# Patient Record
Sex: Female | Born: 1950
Health system: Southern US, Community
[De-identification: ages and names within clinical notes are randomized; demographics above are authoritative.]

## PROBLEM LIST (undated history)

## (undated) DIAGNOSIS — C801 Malignant (primary) neoplasm, unspecified: Secondary | ICD-10-CM

## (undated) DIAGNOSIS — N200 Calculus of kidney: Secondary | ICD-10-CM

## (undated) HISTORY — PX: CHOLECYSTECTOMY: SHX55

## (undated) HISTORY — DX: Malignant (primary) neoplasm, unspecified: C80.1

## (undated) HISTORY — DX: Calculus of kidney: N20.0

---

## 1998-05-06 ENCOUNTER — Ambulatory Visit (HOSPITAL_COMMUNITY): Admission: RE | Admit: 1998-05-06 | Discharge: 1998-05-06 | Payer: Self-pay | Admitting: Obstetrics and Gynecology

## 1998-05-08 ENCOUNTER — Ambulatory Visit (HOSPITAL_COMMUNITY): Admission: RE | Admit: 1998-05-08 | Discharge: 1998-05-08 | Payer: Self-pay

## 1998-05-08 ENCOUNTER — Encounter: Payer: Self-pay | Admitting: Obstetrics and Gynecology

## 1998-11-18 ENCOUNTER — Ambulatory Visit (HOSPITAL_COMMUNITY): Admission: RE | Admit: 1998-11-18 | Discharge: 1998-11-18 | Payer: Self-pay | Admitting: Obstetrics and Gynecology

## 1998-11-18 ENCOUNTER — Encounter: Payer: Self-pay | Admitting: Obstetrics and Gynecology

## 2005-04-12 ENCOUNTER — Emergency Department (HOSPITAL_COMMUNITY): Admission: EM | Admit: 2005-04-12 | Discharge: 2005-04-12 | Payer: Self-pay | Admitting: Family Medicine

## 2006-06-07 ENCOUNTER — Emergency Department (HOSPITAL_COMMUNITY): Admission: EM | Admit: 2006-06-07 | Discharge: 2006-06-07 | Payer: Self-pay | Admitting: Emergency Medicine

## 2009-01-15 ENCOUNTER — Encounter: Payer: Self-pay | Admitting: Family Medicine

## 2009-01-15 ENCOUNTER — Ambulatory Visit: Payer: Self-pay | Admitting: Family Medicine

## 2009-01-15 ENCOUNTER — Other Ambulatory Visit: Admission: RE | Admit: 2009-01-15 | Discharge: 2009-01-15 | Payer: Self-pay | Admitting: Family Medicine

## 2009-01-15 DIAGNOSIS — Z85828 Personal history of other malignant neoplasm of skin: Secondary | ICD-10-CM

## 2009-01-22 ENCOUNTER — Ambulatory Visit: Payer: Self-pay | Admitting: Family Medicine

## 2009-01-22 ENCOUNTER — Encounter: Payer: Self-pay | Admitting: Family Medicine

## 2009-08-01 ENCOUNTER — Ambulatory Visit: Payer: Self-pay | Admitting: Family Medicine

## 2009-08-01 DIAGNOSIS — F329 Major depressive disorder, single episode, unspecified: Secondary | ICD-10-CM

## 2009-08-12 ENCOUNTER — Ambulatory Visit: Payer: Self-pay | Admitting: Family Medicine

## 2010-01-23 ENCOUNTER — Ambulatory Visit: Payer: Self-pay | Admitting: Family Medicine

## 2010-01-23 LAB — CONVERTED CEMR LAB
ALT: 25 units/L (ref 0–35)
AST: 24 units/L (ref 0–37)
Albumin: 4 g/dL (ref 3.5–5.2)
Alkaline Phosphatase: 55 units/L (ref 39–117)
BUN: 15 mg/dL (ref 6–23)
Basophils Absolute: 0 10*3/uL (ref 0.0–0.1)
Basophils Relative: 0.4 % (ref 0.0–3.0)
Bilirubin Urine: NEGATIVE
Bilirubin, Direct: 0.1 mg/dL (ref 0.0–0.3)
CO2: 27 meq/L (ref 19–32)
Calcium: 9.4 mg/dL (ref 8.4–10.5)
Chloride: 107 meq/L (ref 96–112)
Cholesterol: 206 mg/dL — ABNORMAL HIGH (ref 0–200)
Creatinine, Ser: 0.8 mg/dL (ref 0.4–1.2)
Direct LDL: 118.3 mg/dL
Eosinophils Absolute: 0.2 10*3/uL (ref 0.0–0.7)
Eosinophils Relative: 2.1 % (ref 0.0–5.0)
GFR calc non Af Amer: 82.95 mL/min (ref >60)
Glucose, Bld: 92 mg/dL (ref 70–99)
Glucose, Urine, Semiquant: NEGATIVE
HCT: 43.2 % (ref 36.0–46.0)
HDL: 37.3 mg/dL — ABNORMAL LOW (ref >39.00)
Hemoglobin: 15.1 g/dL — ABNORMAL HIGH (ref 12.0–15.0)
Ketones, urine, test strip: NEGATIVE
Lymphocytes Relative: 25.8 % (ref 12.0–46.0)
Lymphs Abs: 2 10*3/uL (ref 0.7–4.0)
MCHC: 35.1 g/dL (ref 30.0–36.0)
MCV: 95.6 fL (ref 78.0–100.0)
Monocytes Absolute: 0.7 10*3/uL (ref 0.1–1.0)
Monocytes Relative: 9 % (ref 3.0–12.0)
Neutro Abs: 4.9 10*3/uL (ref 1.4–7.7)
Neutrophils Relative %: 62.7 % (ref 43.0–77.0)
Nitrite: NEGATIVE
Platelets: 280 10*3/uL (ref 150.0–400.0)
Potassium: 4.6 meq/L (ref 3.5–5.1)
Protein, U semiquant: NEGATIVE
RBC: 4.51 M/uL (ref 3.87–5.11)
RDW: 13.4 % (ref 11.5–14.6)
Sodium: 142 meq/L (ref 135–145)
Specific Gravity, Urine: 1.015
TSH: 2.34 microintl units/mL (ref 0.35–5.50)
Total Bilirubin: 0.4 mg/dL (ref 0.3–1.2)
Total CHOL/HDL Ratio: 6
Total Protein: 6.9 g/dL (ref 6.0–8.3)
Triglycerides: 253 mg/dL — ABNORMAL HIGH (ref 0.0–149.0)
Urobilinogen, UA: 0.2
VLDL: 50.6 mg/dL — ABNORMAL HIGH (ref 0.0–40.0)
WBC Urine, dipstick: NEGATIVE
WBC: 7.8 10*3/uL (ref 4.5–10.5)
pH: 5

## 2010-01-29 ENCOUNTER — Other Ambulatory Visit: Admission: RE | Admit: 2010-01-29 | Discharge: 2010-01-29 | Payer: Self-pay | Admitting: Family Medicine

## 2010-01-29 ENCOUNTER — Ambulatory Visit: Payer: Self-pay | Admitting: Family Medicine

## 2010-04-22 ENCOUNTER — Telehealth: Payer: Self-pay | Admitting: Family Medicine

## 2010-04-22 ENCOUNTER — Ambulatory Visit: Payer: Self-pay | Admitting: Family Medicine

## 2010-04-27 ENCOUNTER — Ambulatory Visit: Payer: Self-pay | Admitting: Licensed Clinical Social Worker

## 2010-05-06 ENCOUNTER — Ambulatory Visit: Payer: Self-pay | Admitting: Family Medicine

## 2010-05-20 ENCOUNTER — Ambulatory Visit: Payer: Self-pay | Admitting: Licensed Clinical Social Worker

## 2010-07-01 ENCOUNTER — Ambulatory Visit: Admit: 2010-07-01 | Payer: Self-pay | Admitting: Licensed Clinical Social Worker

## 2010-07-19 LAB — CONVERTED CEMR LAB
ALT: 31 units/L (ref 0–35)
AST: 19 units/L (ref 0–37)
Albumin: 3.8 g/dL (ref 3.5–5.2)
Alkaline Phosphatase: 67 units/L (ref 39–117)
BUN: 15 mg/dL (ref 6–23)
Basophils Absolute: 0 10*3/uL (ref 0.0–0.1)
Basophils Relative: 0.4 % (ref 0.0–3.0)
Bilirubin Urine: NEGATIVE
Bilirubin, Direct: 0 mg/dL (ref 0.0–0.3)
CO2: 29 meq/L (ref 19–32)
Calcium: 9.4 mg/dL (ref 8.4–10.5)
Chloride: 110 meq/L (ref 96–112)
Cholesterol: 212 mg/dL — ABNORMAL HIGH (ref 0–200)
Creatinine, Ser: 0.7 mg/dL (ref 0.4–1.2)
Direct LDL: 140.9 mg/dL
Eosinophils Absolute: 0.1 10*3/uL (ref 0.0–0.7)
Eosinophils Relative: 1.3 % (ref 0.0–5.0)
GFR calc non Af Amer: 91.54 mL/min (ref >60)
Glucose, Bld: 88 mg/dL (ref 70–99)
Glucose, Urine, Semiquant: NEGATIVE
HCT: 44.5 % (ref 36.0–46.0)
HDL: 32.3 mg/dL — ABNORMAL LOW (ref >39.00)
Hemoglobin: 15.7 g/dL — ABNORMAL HIGH (ref 12.0–15.0)
Ketones, urine, test strip: NEGATIVE
Lymphocytes Relative: 23.3 % (ref 12.0–46.0)
Lymphs Abs: 1.8 10*3/uL (ref 0.7–4.0)
MCHC: 35.3 g/dL (ref 30.0–36.0)
MCV: 91.8 fL (ref 78.0–100.0)
Monocytes Absolute: 0.6 10*3/uL (ref 0.1–1.0)
Monocytes Relative: 7.9 % (ref 3.0–12.0)
Neutro Abs: 5.3 10*3/uL (ref 1.4–7.7)
Neutrophils Relative %: 67.1 % (ref 43.0–77.0)
Nitrite: NEGATIVE
Platelets: 289 10*3/uL (ref 150.0–400.0)
Potassium: 4.2 meq/L (ref 3.5–5.1)
Protein, U semiquant: NEGATIVE
RBC: 4.85 M/uL (ref 3.87–5.11)
RDW: 12.6 % (ref 11.5–14.6)
Sodium: 143 meq/L (ref 135–145)
Specific Gravity, Urine: >=1.03
TSH: 1.45 microintl units/mL (ref 0.35–5.50)
Total Bilirubin: 1.1 mg/dL (ref 0.3–1.2)
Total CHOL/HDL Ratio: 7
Total Protein: 7.3 g/dL (ref 6.0–8.3)
Triglycerides: 186 mg/dL — ABNORMAL HIGH (ref 0.0–149.0)
Urobilinogen, UA: 0.2
VLDL: 37.2 mg/dL (ref 0.0–40.0)
WBC Urine, dipstick: NEGATIVE
WBC: 7.8 10*3/uL (ref 4.5–10.5)
pH: 5.5

## 2010-07-21 NOTE — Assessment & Plan Note (Signed)
Summary: bp check//lch   Vital Signs:  Patient profile:   60 year old female Menstrual status:  postmenopausal Weight:      200 pounds Temp:     98.4 degrees F oral BP sitting:   160 / 90  (left arm) Cuff size:   regular  Vitals Entered By: Kern Reap CMA Duncan Dull) (April 22, 2010 8:17 AM) CC: blood pressure concerns   CC:  blood pressure concerns.  History of Present Illness: Julia Norman is a 60 year old female, who comes in today for evaluation of 2 problems.  She is been checking her blood pressure at a local pharmacy.  Its been running 140/94.  She decided to come in and get it checked here today.  She's never had a blood pressure in the past, although there is a positive family history........ father and brother.......... who have hypertension.  Physical examination in August.  We were on Celexa 20 mg daily for some mild depression in the the rest of 10.5, one to two tablets b.i.d., p.r.n.  She stopped the Celexa for reasons unknown, sure of.  She states she didn't have any side effects.  She does quit taking them.  Is also having more trouble with anxiety and depression and smoking a lot of cigarettes.  I recommend at this juncture.  She sees Judithe Modest and restart her Celexa  Allergies: 1)  Codeine  Past History:  Past medical, surgical, family and social histories (including risk factors) reviewed for relevance to current acute and chronic problems.  Past Medical History: Reviewed history from 01/15/2009 and no changes required. skin cancer kidney stones childbirth x 21 adopted stepchild  Past Surgical History: Reviewed history from 01/15/2009 and no changes required. Cholecystectomy  Family History: Reviewed history from 01/15/2009 and no changes required. Father: heart disease Mother: deceased - cancer, gall bladder, depression, repritory hypertention Siblings: 2 brothers                1 -  depression               1 - MS               Social  History: Reviewed history from 01/15/2009 and no changes required. 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 Take 1 tablet by mouth every morning  Problem # 2:  DEPRESSIVE DISORDER (ICD-311) Assessment: Deteriorated  Her updated medication list for this problem includes:    Lorazepam 0.5 Mg Tabs (Lorazepam) .Marland Kitchen... Take 1-2 tabs two times a day as needed for axiety    Celexa 20 Mg Tabs (Citalopram hydrobromide) .Marland Kitchen... Take 1 tablet by mouth every morning  Complete Medication List: 1)  Lorazepam 0.5 Mg Tabs (Lorazepam) .... Take 1-2 tabs two times a day as needed for axiety 2)  Celexa 20 Mg Tabs (Citalopram hydrobromide) .... Take 1 tablet by mouth every morning 3)  Maxzide-25 37.5-25 Mg Tabs (Triamterene-hctz) .... Take 1 tablet by mouth every morning  Patient Instructions:  1)  restart these Celexa 20 mg a day at bedtime.  Call [facility] today to make an appointment to see Judithe Modest ASAP. 2)  Begin hydrochlorothiazide 25 mg one tablet daily in the morning. 3)  Purchase a pump  up digital blood pressure cuff, and measured y  blood pressure every morning.  Return in two weeks for follow-up and bring all the data and the device with you Prescriptions: MAXZIDE-25 37.5-25 MG TABS (TRIAMTERENE-HCTZ) Take 1 tablet by mouth every morning  #100 x 3   Entered and Authorized by:   Roderick Pee MD   Signed  by:   Roderick Pee MD on 04/22/2010   Method used:   Electronically to        [facility] (retail)       [address]       [place], [place]  [address]       Ph: [phone]       Fax: [phone]   RxID:   [id number]    Orders Added: 1)  Est. Patient Level IV [24401]

## 2010-07-21 NOTE — Assessment & Plan Note (Signed)
Summary: CPX/PAP/NJR   Vital Signs:  Patient profile:   60 year old female Menstrual status:  postmenopausal Height:      61.5 inches Weight:      200 pounds BMI:     37.31 Temp:     98.0 degrees F oral BP sitting:   140 / 90  (left arm) Cuff size:   regular  Vitals Entered By: Julia Norman CMA (January 29, 2010 9:39 AM) CC: cpx with labs, pap, src   CC:  cpx with labs, pap, and src.  History of Present Illness: Julia Norman is a 60 year old female, who comes in today for general physical examination because she is a history of mild underlying depression secondary to the fact that her granddaughter has a brain tumor............. unfortunately she died in 12-14-22.....  She gets routine eye care, dental care, BSE monthly, annual mammography by portable unit.  Recommend digital mammogram at the breast Center.  Tetanus 2010, seasonal flu 2010, colonoscopy, normal 4 years ago,  Review of systems negative, except she is having some sharp pain in the right eighth, ninth, 10th ribs.  She states has been gone on for about 4 months.  If it occurs maybe once or twice a week.  It sharp lasts for a few seconds and goes away.  It is associated with twisting or turning.  No cardiac or pulmonary symptoms.  No history of trauma  She recently had a squamous cell carcinoma removed from her left lower extremity by Dr. Terri Piedra, her dermatologist.  She has had a history of skin cancers in the past  she's also noticed bilateral decrease in hearing.  She did have exposure to loud noises.  A teenager  Preventive Screening-Counseling & Management  Alcohol-Tobacco     Smoking Status: quit  Current Medications (verified): 1)  Lorazepam 0.5 Mg Tabs (Lorazepam) .... Take 1-2 Tabs Two Times A Day As Needed For Axiety  Allergies (verified): 1)  Codeine  Past History:  Past medical, surgical, family and social histories (including risk factors) reviewed, and no changes noted (except as noted below).  Past Medical  History: Reviewed history from 01/15/2009 and no changes required. skin cancer kidney stones childbirth x 21 adopted stepchild  Past Surgical History: Reviewed history from 01/15/2009 and no changes required. Cholecystectomy  Family History: Reviewed history from 01/15/2009 and no changes required. Father: heart disease Mother: deceased - cancer, gall bladder, depression, repritory hypertention Siblings: 2 brothers                1 -  depression               1 - MS               Social History: Reviewed history from 01/15/2009 and no changes required. 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 No apparent alopecia or balding. Eyes:  No corneal or conjunctival inflammation noted. EOMI. Perrla. Funduscopic exam benign, without hemorrhages, exudates or papilledema. Vision grossly normal. Ears:  External ear exam shows no significant lesions or deformities.  Otoscopic examination reveals clear canals, tympanic membranes are intact bilaterally without bulging, retraction, inflammation or discharge. Hearing is grossly normal bilaterally. Nose:  External nasal examination shows no deformity or inflammation. Nasal mucosa are pink and moist  without lesions or exudates. Mouth:  Oral mucosa and oropharynx without lesions or exudates.  Teeth in good repair. Neck:  No deformities, masses, or tenderness noted. Chest Wall:  No deformities, masses, or tenderness noted. Breasts:  No mass, nodules, thickening, tenderness, bulging, retraction, inflamation, nipple discharge or skin changes noted.   Lungs:  Normal respiratory effort, chest expands symmetrically. Lungs are clear to auscultation, no  crackles or wheezes. Heart:  Normal rate and regular rhythm. S1 and S2 normal without gallop, murmur, click, rub or other extra sounds. Abdomen:  Bowel sounds positive,abdomen soft and non-tender without masses, organomegaly or hernias noted. Rectal:  No external abnormalities noted. Normal sphincter tone. No rectal masses or tenderness. Genitalia:  Pelvic Exam:        External: normal female genitalia without lesions or masses        Vagina: normal without lesions or masses        Cervix: normal without lesions or masses        Adnexa: normal bimanual exam without masses or fullness        Uterus: normal by palpation        Pap smear: performed Msk:  No deformity or scoliosis noted of thoracic or lumbar spine.   Pulses:  R and L carotid,radial,femoral,dorsalis pedis and posterior tibial pulses are full and equal bilaterally Extremities:  No clubbing, cyanosis, edema, or deformity noted with normal full range of motion of all joints.   Neurologic:  No cranial nerve deficits noted. Station and gait are normal. Plantar reflexes are down-going bilaterally. DTRs are symmetrical throughout. Sensory, motor and coordinative functions appear intact. Skin:  total body skin exam normal except for scars from previous skin cancer removal by Dr. Terri Piedra Cervical Nodes:  No lymphadenopathy noted Axillary Nodes:  No palpable lymphadenopathy Inguinal Nodes:  No significant adenopathy Psych:  Cognition and judgment appear intact. Alert and cooperative with normal attention span and concentration. No apparent delusions, illusions, hallucinations   Impression & Recommendations:  Problem # 1:  DEPRESSIVE DISORDER (ICD-311) Assessment Improved  The following medications were removed from the medication list:    Celexa 20 Mg Tabs (Citalopram hydrobromide) .Marland Kitchen... 1 tab @ bedtime Her updated medication list for this problem includes:    Lorazepam 0.5 Mg Tabs (Lorazepam) .Marland Kitchen... Take 1-2 tabs two times a day as  needed for axiety    Celexa 20 Mg Tabs (Citalopram hydrobromide) .Marland Kitchen... Take 1 tablet by mouth every morning  Orders: Prescription Created Electronically [id number]) EKG w/ Interpretation (93000)  Problem # 2:  HEALTH SCREENING (ICD-V70.0) Assessment: Unchanged  Orders: Prescription Created Electronically [id number]) EKG w/ Interpretation (93000)  Complete Medication List: 1)  Lorazepam 0.5 Mg Tabs (Lorazepam) .... Take 1-2 tabs two times a day as needed for axiety 2)  Celexa 20 Mg Tabs (Citalopram hydrobromide) .... Take 1 tablet by mouth every morning  Patient Instructions: 1)  Please schedule a follow-up appointment in 1 year. 2)  It is important that you exercise regularly at least 20 minutes 5 times a week. If you develop chest pain, have severe difficulty breathing, or feel very tired , stop exercising immediately and seek medical attention. 3)  Schedule your mammogram. 4)  Schedule a colonoscopy/sigmoidoscopy to help detect colon cancer. 5)  Take calcium +Vitamin D daily. 6)  Take an Aspirin every day. 7)  I would recommend that you call Julia Norman at [facility], your nose, throat, for an audiogram Prescriptions: CELEXA 20 MG TABS (CITALOPRAM HYDROBROMIDE) Take 1 tablet by mouth every  morning  #100 x 3   Entered and Authorized by:   Julia Pee MD   Signed by:   Julia Pee MD on 01/29/2010   Method used:   Electronically to        [facility]  [address] [id number] (retail)       [address]       [place], [place]  [address]       Ph: [phone] or [phone]       Fax: [phone]   RxID:   [id number]

## 2010-07-21 NOTE — Assessment & Plan Note (Signed)
Summary: to discuss anxiety meds/dm   Vital Signs:  Patient profile:   60 year old female Menstrual status:  postmenopausal Weight:      205 pounds Temp:     98.7 degrees F oral BP sitting:   110 / 80  (left arm) Cuff size:   regular  Vitals Entered By: Kern Reap CMA Duncan Dull) (August 01, 2009 11:26 AM)  Reason for Visit rx for anxiety  History of Present Illness: Julia Norman is a 60 year old female, married, nonsmoker, Merchandiser, retail at [employer], who comes in today for anxiety and depression.  She's been dealing with these symptoms for the past two years.  There is a lot of stress at work, her husband is out of work, her granddaughter has been recently diagnosed with neuroblastoma.  She has fluctuating moods, but states she is sleeping well.  She's also binge eating and is up to 205 pounds.  She denies any suicidal ideation  Allergies: 1)  Codeine  Past History:  Past medical, surgical, family and social histories (including risk factors) reviewed, and no changes noted (except as noted below).  Past Medical History: Reviewed history from 01/15/2009 and no changes required. skin cancer kidney stones childbirth x 21 adopted stepchild  Past Surgical History: Reviewed history from 01/15/2009 and no changes required. Cholecystectomy  Family History: Reviewed history from 01/15/2009 and no changes required. Father: heart disease Mother: deceased - cancer, gall bladder, depression, repritory hypertention Siblings: 2 brothers                1 -  depression               1 - MS               Social History: Reviewed history from 01/15/2009 and no changes required. 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      Problems:  Medical Problems Added: 1)  Dx of Depressive Disorder  (ICD-311)  Impression & Recommendations:  Problem # 1:  DEPRESSIVE DISORDER (ICD-311) Assessment New  Her updated medication list for this problem includes:    Lorazepam 0.5 Mg Tabs (Lorazepam) .Marland Kitchen... Take 1-2 tabs two times a day as needed for axiety    Celexa 20 Mg Tabs (Citalopram hydrobromide) .Marland Kitchen... 1 tab @ bedtime  Complete Medication List: 1)  Lorazepam 0.5 Mg Tabs (Lorazepam) .... Take 1-2 tabs two times a day as needed for axiety 2)  Celexa 20 Mg Tabs (Citalopram hydrobromide) .Marland Kitchen.. 1 tab @ bedtime  Patient Instructions: 1)  begin Celexa 20 mg at bedtime.  Return in one week for follow-up Prescriptions: CELEXA 20 MG TABS (CITALOPRAM HYDROBROMIDE) 1 tab @ bedtime  #30 x 1   Entered and Authorized by:   Roderick Pee MD   Signed by:   Roderick Pee MD on 08/01/2009   Method used:   [facility]        [facility]  [address] [id number]       [address]       [place], [place]  [address]       Ph: [phone] or [phone]       Fax: [phone]   RxID:   [id number]

## 2010-07-21 NOTE — Progress Notes (Signed)
Summary: Pt req script for Celexa to [facility]  Phone Note Refill Request Call back at Work Phone [phone] Message from:  Patient on April 22, 2010 1:25 PM  Refills Requested: Medication #1:  CELEXA 20 MG TABS Take 1 tablet by mouth every morning   Dosage confirmed as above?Dosage Confirmed  Method Requested: Telephone to Pharmacy Initial call taken by: Lucy Antigua,  April 22, 2010 1:25 PM    Prescriptions: CELEXA 20 MG TABS (CITALOPRAM HYDROBROMIDE) Take 1 tablet by mouth every morning  #100 x 3   Entered by:   Kern Reap CMA (AAMA)   Authorized by:   Roderick Pee MD   Signed by:   Kern Reap CMA (AAMA) on 04/22/2010   Method used:   Electronically to        [facility] (retail)       [address]       [place], [place]  [id number]       Ph: [phone]       Fax: [phone]   RxID:   [id number]

## 2010-07-21 NOTE — Assessment & Plan Note (Signed)
Summary: 2 wk rov/njr   Vital Signs:  Patient profile:   60 year old female Menstrual status:  postmenopausal Weight:      197 pounds Temp:     98.4 degrees F oral BP sitting:   120 / 80  (left arm) Cuff size:   regular  Vitals Entered By: Kern Reap CMA Duncan Dull) (May 06, 2010 9:52 AM) CC: follow-up visit   CC:  follow-up visit.  History of Present Illness: Julia Norman is a 60 year old female, who comes in today for evaluation of two problems.  As noted previously.  Her granddaughter died a couple months ago.  She is currently on Celexa 20 mg nightly.  We discussed increasing the dose slightly to 30 to see if that would help decrease her anxiety and depression.  She is on Maxzide 25 daily BP now back to normal 120/80.  No side effects from medication  Allergies: 1)  Codeine  Past History:  Past medical, surgical, family and social histories (including risk factors) reviewed for relevance to current acute and chronic problems.  Past Medical History: Reviewed history from 01/15/2009 and no changes required. skin cancer kidney stones childbirth x 21 adopted stepchild  Past Surgical History: Reviewed history from 01/15/2009 and no changes required. Cholecystectomy  Family History: Reviewed history from 01/15/2009 and no changes required. Father: heart disease Mother: deceased - cancer, gall bladder, depression, repritory hypertention Siblings: 2 brothers                1 -  depression               1 - MS               Social History: Reviewed history from 01/15/2009 and no changes required. 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 no    Any prior history of allergic reactions to egg and/or gelatin? no    Do you have a sensitivity to the preservative Thimersol? no    Do you  have a past history of Guillan-Barre Syndrome? no    Do you currently have an acute febrile illness? no    Have you ever had a severe reaction to latex? no    Vaccine information given and explained to patient? yes    Are you currently pregnant? no    Lot Number:[id number]   Exp Date:12/19/2010   Site Given  Left Deltoid IM   Physical Exam  General:  Well-developed,well-nourished,in no acute distress; alert,appropriate and cooperative throughout examination   Impression & Recommendations:  Problem # 1:  ESSENTIAL HYPERTENSION (ICD-401.9) Assessment Improved  Her updated medication list for this problem includes:    Maxzide-25 37.5-25 Mg Tabs (Triamterene-hctz) .Marland Kitchen... Take 1 tablet by mouth every morning  Problem # 2:  DEPRESSIVE DISORDER (ICD-311) Assessment: Improved  Her updated medication list for this problem includes:    Lorazepam 0.5 Mg Tabs (Lorazepam) .Marland Kitchen... Take 1-2 tabs two times a day as needed for axiety    Celexa 20 Mg Tabs (Citalopram hydrobromide) .Marland Kitchen... Take 1 & 1/2 at bedtime  Complete Medication List: 1)  Lorazepam 0.5 Mg Tabs (Lorazepam) .... Take 1-2 tabs two times a day as needed for axiety 2)  Celexa 20 Mg Tabs (Citalopram hydrobromide) .... Take 1 & 1/2 at  bedtime 3)  Maxzide-25 37.5-25 Mg Tabs (Triamterene-hctz) .... Take 1 tablet by mouth every morning  Other Orders: Admin 1st Vaccine ([address]) Flu Vaccine 13yrs + ([address])  Patient Instructions: 1)  increase the Celexa to 30 mg daily. 2)  Continue Maxzide one tablet daily.  Check your blood pressure at home once weekly,,,,,,,,, "Sunday morning,,,,,,,,,,,, the goal is to keep your blood pressure 135/85 or less. Prescriptions: CELEXA 20 MG TABS (CITALOPRAM HYDROBROMIDE) Take 1 & 1/2 at bedtime  #150 x 3   Entered and Authorized by:   Jeffrey A Todd MD   Signed by:   Jeffrey A Todd MD on 05/06/2010   Method used:   Electronically to        [facility] (retail)       [address]       [place], [place]  [id number]       Ph: [phone]       Fax: [phone]   RxID:   [id number]    Orders Added: 1)  Admin 1st Vaccine [90471] 2)  Flu Vaccine 40yrs + [36644] 3)  Est. Patient Level III [03474]

## 2010-07-21 NOTE — Assessment & Plan Note (Signed)
Summary: 1 wk rov/njr/PT RESCD//CCM   Vital Signs:  Patient profile:   60 year old female Menstrual status:  postmenopausal BP sitting:   120 / 80  (left arm) Cuff size:   regular  Vitals Entered By: Kern Reap CMA Duncan Dull) (August 12, 2009 11:45 AM)  Reason for Visit follow up anxiety  History of Present Illness: Julia Norman is a 60 year old female, who comes in today for evaluation of anxiety.  We start her on Celexa 20 mg nightly 10 days ago.  She states she feels 50% better.  She said her mood is better.  Her anxiety is, less she's not had to take any of the Ativan.  We discussed various options.  She likes to spend 20 mg daily.  Next CPX July  Allergies: 1)  Codeine  Review of Systems      See HPI  Physical Exam  General:  Well-developed,well-nourished,in no acute distress; alert,appropriate and cooperative throughout examination Psych:  Cognition and judgment appear intact. Alert and cooperative with normal attention span and concentration. No apparent delusions, illusions, hallucinations   Impression & Recommendations:  Problem # 1:  DEPRESSIVE DISORDER (ICD-311) Assessment Improved  Her updated medication list for this problem includes:    Lorazepam 0.5 Mg Tabs (Lorazepam) .Marland Kitchen... Take 1-2 tabs two times a day as needed for axiety    Celexa 20 Mg Tabs (Citalopram hydrobromide) .Marland Kitchen... 1 tab @ bedtime  Complete Medication List: 1)  Lorazepam 0.5 Mg Tabs (Lorazepam) .... Take 1-2 tabs two times a day as needed for axiety 2)  Celexa 20 Mg Tabs (Citalopram hydrobromide) .Marland Kitchen.. 1 tab @ bedtime  Patient Instructions: 1)  continued these Celexa 20 mg a day at bedtime.  If in 4 weeks, you wanr t to inc,  the dose to 30 mg a day, and call Fleet Contras .Marland Kitchen... for a voicemail.  We will change her medication by phone.  Return in July for annual exam

## 2011-05-31 ENCOUNTER — Other Ambulatory Visit: Payer: Self-pay | Admitting: *Deleted

## 2011-05-31 MED ORDER — CITALOPRAM HYDROBROMIDE 20 MG PO TABS: 20.0000 mg | ORAL_TABLET | Freq: Every day | ORAL | Status: DC

## 2011-07-02 ENCOUNTER — Other Ambulatory Visit: Payer: Self-pay | Admitting: Family Medicine

## 2011-07-02 MED ORDER — CITALOPRAM HYDROBROMIDE 20 MG PO TABS: 20.0000 mg | ORAL_TABLET | Freq: Every day | ORAL | Status: DC

## 2011-07-02 NOTE — Telephone Encounter (Signed)
Pt has ov sch for 07-19-11. Pt need refill on generic celexa 20 mg. Pt is out. [facility] [phone]

## 2011-07-19 ENCOUNTER — Ambulatory Visit: Payer: Self-pay | Admitting: Family Medicine

## 2011-08-05 ENCOUNTER — Telehealth: Payer: Self-pay | Admitting: Family Medicine

## 2011-08-05 MED ORDER — CITALOPRAM HYDROBROMIDE 20 MG PO TABS: 20.0000 mg | ORAL_TABLET | Freq: Every day | ORAL | Status: DC

## 2011-08-05 NOTE — Telephone Encounter (Signed)
Patient is stating that her rx has run out and her appt is not until 09/03/11. Patient requests to have her citalopram called into [facility] on [address]. Please assist and inform patient when done.

## 2011-08-17 ENCOUNTER — Other Ambulatory Visit (INDEPENDENT_AMBULATORY_CARE_PROVIDER_SITE_OTHER): Payer: 59

## 2011-08-17 DIAGNOSIS — Z0000 Encounter for general adult medical examination without abnormal findings: Secondary | ICD-10-CM

## 2011-08-17 LAB — CBC WITH DIFFERENTIAL/PLATELET
Basophils Absolute: 0 10*3/uL (ref 0.0–0.1)
Eosinophils Relative: 1.9 % (ref 0.0–5.0)
MCV: 93.2 fl (ref 78.0–100.0)
Monocytes Absolute: 0.7 10*3/uL (ref 0.1–1.0)
Monocytes Relative: 7.9 % (ref 3.0–12.0)
Neutrophils Relative %: 67 % (ref 43.0–77.0)
Platelets: 290 10*3/uL (ref 150.0–400.0)
RDW: 13.9 % (ref 11.5–14.6)
WBC: 8.7 10*3/uL (ref 4.5–10.5)

## 2011-08-17 LAB — POCT URINALYSIS DIPSTICK
Bilirubin, UA: NEGATIVE
Leukocytes, UA: NEGATIVE
Nitrite, UA: NEGATIVE
Protein, UA: NEGATIVE
Urobilinogen, UA: 0.2
pH, UA: 5.5

## 2011-08-17 LAB — LIPID PANEL
Cholesterol: 211 mg/dL — ABNORMAL HIGH (ref 0–200)
Total CHOL/HDL Ratio: 5
Triglycerides: 206 mg/dL — ABNORMAL HIGH (ref 0.0–149.0)
VLDL: 41.2 mg/dL — ABNORMAL HIGH (ref 0.0–40.0)

## 2011-08-17 LAB — BASIC METABOLIC PANEL
BUN: 15 mg/dL (ref 6–23)
CO2: 25 mEq/L (ref 19–32)
Chloride: 107 mEq/L (ref 96–112)
Creatinine, Ser: 0.8 mg/dL (ref 0.4–1.2)

## 2011-08-17 LAB — HEPATIC FUNCTION PANEL
ALT: 23 U/L (ref 0–35)
Bilirubin, Direct: 0 mg/dL (ref 0.0–0.3)
Total Bilirubin: 0.2 mg/dL — ABNORMAL LOW (ref 0.3–1.2)

## 2011-08-31 ENCOUNTER — Ambulatory Visit (INDEPENDENT_AMBULATORY_CARE_PROVIDER_SITE_OTHER): Payer: 59 | Admitting: Family Medicine

## 2011-08-31 ENCOUNTER — Other Ambulatory Visit (HOSPITAL_COMMUNITY)
Admission: RE | Admit: 2011-08-31 | Discharge: 2011-08-31 | Disposition: A | Discharge status: Home / Self Care | Payer: 59 | Source: Ambulatory Visit | Attending: Family Medicine | Admitting: Family Medicine

## 2011-08-31 ENCOUNTER — Encounter: Payer: Self-pay | Admitting: Family Medicine

## 2011-08-31 VITALS — BP 140/90 | Temp 98.3°F | Ht 61.25 in | Wt 203.0 lb

## 2011-08-31 DIAGNOSIS — Z01419 Encounter for gynecological examination (general) (routine) without abnormal findings: Secondary | ICD-10-CM

## 2011-08-31 DIAGNOSIS — I10 Essential (primary) hypertension: Secondary | ICD-10-CM

## 2011-08-31 DIAGNOSIS — R319 Hematuria, unspecified: Secondary | ICD-10-CM

## 2011-08-31 LAB — POCT URINALYSIS DIPSTICK
Bilirubin, UA: NEGATIVE
Ketones, UA: NEGATIVE
Leukocytes, UA: NEGATIVE
Protein, UA: NEGATIVE
Spec Grav, UA: 1.025
pH, UA: 6

## 2011-08-31 NOTE — Progress Notes (Signed)
  Subjective:    Patient ID: Julia Norman, female    DOB: Oct 25, 1950, 61 y.o.   MRN: [id number]  HPI Julia Norman is a 61 year old female nonsmoker who comes in today for general physical examination because of mild elevation of her blood pressure and depression  She stopped taking the Maxide and her BP is 140/90 here she states at home her blood pressure is 135/85 or less.  She takes 20 mg of Celexa bedtime  She does not get routine eye care referred to Dr. Vonna Kotyk, regular dental care, BSE sporadically advised and showed how to do BSE monthly, last mammogram 16 months ago advised annual mammography, colonoscopy 2008 normal followup in GI, tetanus 2010.   Review of Systems  Constitutional: Negative.   HENT: Negative.   Eyes: Negative.   Respiratory: Negative.   Cardiovascular: Negative.   Gastrointestinal: Negative.   Genitourinary: Negative.   Musculoskeletal: Negative.   Neurological: Negative.   Hematological: Negative.   Psychiatric/Behavioral: Negative.        Objective:   Physical Exam  Constitutional: She appears well-developed and well-nourished.  HENT:  Head: Normocephalic and atraumatic.  Right Ear: External ear normal.  Left Ear: External ear normal.  Nose: Nose normal.  Mouth/Throat: Oropharynx is clear and moist.  Eyes: EOM are normal. Pupils are equal, round, and reactive to light.  Neck: Normal range of motion. Neck supple. No thyromegaly present.  Cardiovascular: Normal rate, regular rhythm, normal heart sounds and intact distal pulses.  Exam reveals no gallop and no friction rub.   No murmur heard. Pulmonary/Chest: Effort normal and breath sounds normal.  Abdominal: Soft. Bowel sounds are normal. She exhibits no distension and no mass. There is no tenderness. There is no rebound.  Genitourinary: Vagina normal and uterus normal. Guaiac negative stool. No vaginal discharge found.       Bilateral breast exam normal  Musculoskeletal: Normal range of motion.    Lymphadenopathy:    She has no cervical adenopathy.  Neurological: She is alert. She has normal reflexes. No cranial nerve deficit. She exhibits normal muscle tone. Coordination normal.  Skin: Skin is warm and dry.  Psychiatric: She has a normal mood and affect. Her behavior is normal. Judgment and thought content normal.          Assessment & Plan:  Healthy female  Obesity discussed diet exercise and weight loss  Borderline blood pressure continue monitoring at home  History of mild depression continue Celexa 20 mg daily  Recommend annual eye exam regular dental care BSE monthly and you mammography followup with [place] one year sooner if any problems

## 2011-08-31 NOTE — Patient Instructions (Signed)
Continue the Celexa one tablet daily at bedtime  Check your blood pressure weekly to be sure it stays normal,,,,,,,,,, 135/85 or less   work on the diet exercise and weight loss  Dr. Mia Creek is the eye doctor  Remember to do a thorough breast exam monthly and call tomorrow and get set up for a mammogram  Return in one year sooner if any problems

## 2011-10-11 ENCOUNTER — Other Ambulatory Visit: Payer: Self-pay | Admitting: Family Medicine

## 2011-10-11 NOTE — Telephone Encounter (Signed)
Pt is out of med

## 2012-04-11 ENCOUNTER — Other Ambulatory Visit: Payer: Self-pay | Admitting: Family Medicine

## 2012-04-11 ENCOUNTER — Telehealth: Payer: Self-pay | Admitting: Family Medicine

## 2012-04-11 NOTE — Telephone Encounter (Signed)
Caller: Phallon/Patient; Patient Name: Julia Norman; PCP: Kelle Darting [facility]); Best Callback Phone Number: [phone] States is out of Celexa 10-22 and pharmacy has sent request for refill to office. Takes 2 20mg  tabs daily but script calls for 1 days but states MD has advised can take 2 daily.  Advised per EPIC, script has been sent to [facility] 10-22.

## 2012-04-13 ENCOUNTER — Other Ambulatory Visit: Payer: Self-pay | Admitting: Family Medicine

## 2012-10-19 ENCOUNTER — Other Ambulatory Visit (INDEPENDENT_AMBULATORY_CARE_PROVIDER_SITE_OTHER): Payer: 59

## 2012-10-19 DIAGNOSIS — Z0000 Encounter for general adult medical examination without abnormal findings: Secondary | ICD-10-CM

## 2012-10-19 LAB — LIPID PANEL
HDL: 31.8 mg/dL — ABNORMAL LOW (ref >39.00)
Total CHOL/HDL Ratio: 6
Triglycerides: 160 mg/dL — ABNORMAL HIGH (ref 0.0–149.0)
VLDL: 32 mg/dL (ref 0.0–40.0)

## 2012-10-19 LAB — POCT URINALYSIS DIPSTICK
Ketones, UA: NEGATIVE
Nitrite, UA: NEGATIVE
pH, UA: 5.5

## 2012-10-19 LAB — BASIC METABOLIC PANEL
BUN: 15 mg/dL (ref 6–23)
CO2: 26 mEq/L (ref 19–32)
Chloride: 108 mEq/L (ref 96–112)
Glucose, Bld: 88 mg/dL (ref 70–99)
Potassium: 3.7 mEq/L (ref 3.5–5.1)

## 2012-10-19 LAB — CBC WITH DIFFERENTIAL/PLATELET
Basophils Relative: 0.6 % (ref 0.0–3.0)
Eosinophils Absolute: 0.2 10*3/uL (ref 0.0–0.7)
HCT: 44.2 % (ref 36.0–46.0)
Lymphocytes Relative: 24.7 % (ref 12.0–46.0)
MCV: 92.2 fl (ref 78.0–100.0)
Monocytes Relative: 8.7 % (ref 3.0–12.0)
Platelets: 313 10*3/uL (ref 150.0–400.0)
RDW: 13.3 % (ref 11.5–14.6)

## 2012-10-19 LAB — HEPATIC FUNCTION PANEL
ALT: 23 U/L (ref 0–35)
Alkaline Phosphatase: 52 U/L (ref 39–117)
Bilirubin, Direct: 0 mg/dL (ref 0.0–0.3)
Total Protein: 6.9 g/dL (ref 6.0–8.3)

## 2012-10-26 ENCOUNTER — Encounter: Payer: Self-pay | Admitting: Family Medicine

## 2012-10-26 ENCOUNTER — Ambulatory Visit (INDEPENDENT_AMBULATORY_CARE_PROVIDER_SITE_OTHER): Payer: 59 | Admitting: Family Medicine

## 2012-10-26 ENCOUNTER — Other Ambulatory Visit (HOSPITAL_COMMUNITY)
Admission: RE | Admit: 2012-10-26 | Discharge: 2012-10-26 | Disposition: A | Discharge status: Home / Self Care | Payer: 59 | Source: Ambulatory Visit | Attending: Family Medicine | Admitting: Family Medicine

## 2012-10-26 VITALS — BP 130/90 | Temp 98.6°F | Ht 61.0 in | Wt 204.0 lb

## 2012-10-26 DIAGNOSIS — Z0000 Encounter for general adult medical examination without abnormal findings: Secondary | ICD-10-CM

## 2012-10-26 DIAGNOSIS — Z01419 Encounter for gynecological examination (general) (routine) without abnormal findings: Secondary | ICD-10-CM | POA: Insufficient documentation

## 2012-10-26 NOTE — Patient Instructions (Signed)
Continue diet and exercise and weight loss program  Motrin 600 mg twice daily with food when necessary for joint pain  Remember to do a thorough breast exam monthly and get annual mammogram. Dr. Gweneth Dimitri for eye exams

## 2012-10-26 NOTE — Progress Notes (Signed)
  Subjective:    Patient ID: Julia Norman, female    DOB: [birth year], 62 y.o.   MRN: [id number]  HPI Annmarie is a 62 year old female nonsmoker,,,,,,,, whose worked at [employer] for 38 years,,,, who comes in today for general physical examination  At one time we had her on Maxide for elevated blood pressure BP now is normal off medication  It one time she took some Celexa 20 mg daily for mild depression she stopped the Celexa she feels fine  Overweight she gained some weight but to diet and exercise she lost 9 pounds recently.  Recommended Dr. Gweneth Dimitri eye exams, she gets regular dental care, BSE monthly, last mammogram 2012 recommend annual mammography, colonoscopy and GI normal   Review of Systems  Constitutional: Negative.   HENT: Negative.   Eyes: Negative.   Respiratory: Negative.   Cardiovascular: Negative.   Gastrointestinal: Negative.   Genitourinary: Negative.   Musculoskeletal: Negative.   Neurological: Negative.   Psychiatric/Behavioral: Negative.        Objective:   Physical Exam  Constitutional: She appears well-developed and well-nourished.  HENT:  Head: Normocephalic and atraumatic.  Right Ear: External ear normal.  Left Ear: External ear normal.  Nose: Nose normal.  Mouth/Throat: Oropharynx is clear and moist.  Eyes: EOM are normal. Pupils are equal, round, and reactive to light.  Neck: Normal range of motion. Neck supple. No thyromegaly present.  Cardiovascular: Normal rate, regular rhythm, normal heart sounds and intact distal pulses.  Exam reveals no gallop and no friction rub.   No murmur heard. No carotid or aortic bruits. Peripheral pulses normal  Pulmonary/Chest: Effort normal and breath sounds normal.  Abdominal: Soft. Bowel sounds are normal. She exhibits no distension and no mass. There is no tenderness. There is no rebound.  Genitourinary: Vagina normal and uterus normal. Guaiac negative stool. No vaginal discharge found.  Bilateral breast exam normal   Musculoskeletal: Normal range of motion.  Lymphadenopathy:    She has no cervical adenopathy.  Neurological: She is alert. She has normal reflexes. No cranial nerve deficit. She exhibits normal muscle tone. Coordination normal.  Skin: Skin is warm and dry.  Total body skin exam normal scars from previous skin cancers that were removed  Psychiatric: She has a normal mood and affect. Her behavior is normal. Judgment and thought content normal.          Assessment & Plan:  , healthy female  Overweight,,,,,,, diet exercise and weight loss  Mild osteoarthritis Motrin 600 twice a day when necessary

## 2012-10-26 NOTE — Addendum Note (Signed)
Addended by: Kern Reap B on: 10/26/2012 04:15 PM   Modules accepted: Orders

## 2013-05-22 ENCOUNTER — Encounter: Payer: Self-pay | Admitting: Family

## 2013-05-22 ENCOUNTER — Ambulatory Visit (INDEPENDENT_AMBULATORY_CARE_PROVIDER_SITE_OTHER): Payer: 59 | Admitting: Family

## 2013-05-22 VITALS — BP 130/80 | Temp 98.3°F | Wt 211.0 lb

## 2013-05-22 DIAGNOSIS — R05 Cough: Secondary | ICD-10-CM

## 2013-05-22 DIAGNOSIS — Z720 Tobacco use: Secondary | ICD-10-CM

## 2013-05-22 DIAGNOSIS — J209 Acute bronchitis, unspecified: Secondary | ICD-10-CM

## 2013-05-22 DIAGNOSIS — R059 Cough, unspecified: Secondary | ICD-10-CM

## 2013-05-22 DIAGNOSIS — R062 Wheezing: Secondary | ICD-10-CM

## 2013-05-22 DIAGNOSIS — F17200 Nicotine dependence, unspecified, uncomplicated: Secondary | ICD-10-CM

## 2013-05-22 MED ORDER — BENZONATATE 200 MG PO CAPS: 200.0000 mg | ORAL_CAPSULE | Freq: Two times a day (BID) | ORAL | Status: DC | PRN

## 2013-05-22 MED ORDER — PREDNISONE 20 MG PO TABS: ORAL_TABLET | ORAL | Status: AC

## 2013-05-22 NOTE — Patient Instructions (Signed)

## 2013-05-22 NOTE — Progress Notes (Signed)
   Subjective:    Patient ID: Julia Norman, female    DOB: [birth year], 62 y.o.   MRN: [id number]  HPI 62 year old white female, smoker, patient of Dr. Tawanna Cooler today with complaints of a cough x3 weeks that is worsening. Reports having wheezing and nasal congestion the past 2 days. She has been taking Mucinex without relief.   Review of Systems  Constitutional: Negative.   HENT: Negative.   Respiratory: Positive for cough and wheezing.   Cardiovascular: Negative.   Musculoskeletal: Negative.   Skin: Negative.   Allergic/Immunologic: Negative.   Neurological: Negative.   Hematological: Negative.   Psychiatric/Behavioral: Negative.    Past Medical History  Diagnosis Date  . Cancer     skin  . Kidney stones     History   Social History  . Marital Status: Married    Spouse Name: N/A    Number of Children: N/A  . Years of Education: N/A   Occupational History  . Not on file.   Social History Main Topics  . Smoking status: Current Some Day Smoker  . Smokeless tobacco: Not on file  . Alcohol Use: Yes  . Drug Use: No  . Sexual Activity: Not on file   Other Topics Concern  . Not on file   Social History Narrative  . No narrative on file    Past Surgical History  Procedure Laterality Date  . Cholecystectomy      Family History  Problem Relation Age of Onset  . Cancer Mother   . Depression Mother   . Gallbladder disease Mother   . Heart disease Father     Allergies  Allergen Reactions  . Codeine     No current outpatient prescriptions on file prior to visit.   No current facility-administered medications on file prior to visit.    BP 130/80  Temp(Src) 98.3 F (36.8 C) (Oral)  Wt 211 lb (95.709 kg)chart    Objective:   Physical Exam  Constitutional: She is oriented to person, place, and time. She appears well-developed and well-nourished.  HENT:  Right Ear: External ear normal.  Left Ear: External ear normal.  Nose: Nose normal.  Mouth/Throat:  Oropharynx is clear and moist.  Neck: Normal range of motion. Neck supple.  Cardiovascular: Normal rate, regular rhythm and normal heart sounds.   Pulmonary/Chest: Effort normal. She has wheezes.  Neurological: She is alert and oriented to person, place, and time.  Skin: Skin is warm and dry.  Psychiatric: She has a normal mood and affect.          Assessment & Plan:  Assessment: 1. Bronchitis 2. Cough 3. Wheezing  Plan: Prednisone 60x3, 40x3, 20x3. Tessalon Perles 200 mg 3 times a day as needed. Rest. Drink plenty of fluids. Patient on the opposite symptoms worsen or persist. Recheck as scheduled, and as needed.

## 2014-03-19 ENCOUNTER — Other Ambulatory Visit: Payer: Self-pay | Admitting: Physician Assistant

## 2014-05-27 ENCOUNTER — Other Ambulatory Visit (INDEPENDENT_AMBULATORY_CARE_PROVIDER_SITE_OTHER): Payer: 59

## 2014-05-27 DIAGNOSIS — Z0000 Encounter for general adult medical examination without abnormal findings: Secondary | ICD-10-CM

## 2014-05-27 DIAGNOSIS — R790 Abnormal level of blood mineral: Secondary | ICD-10-CM

## 2014-05-27 LAB — BASIC METABOLIC PANEL
BUN: 15 mg/dL (ref 6–23)
CHLORIDE: 106 meq/L (ref 96–112)
CO2: 26 mEq/L (ref 19–32)
Calcium: 9.4 mg/dL (ref 8.4–10.5)
Creatinine, Ser: 0.9 mg/dL (ref 0.4–1.2)
GFR: 66.41 mL/min (ref >60.00)
Glucose, Bld: 103 mg/dL — ABNORMAL HIGH (ref 70–99)
POTASSIUM: 4.5 meq/L (ref 3.5–5.1)
SODIUM: 139 meq/L (ref 135–145)

## 2014-05-27 LAB — POCT URINALYSIS DIPSTICK
BILIRUBIN UA: NEGATIVE
Glucose, UA: NEGATIVE
Ketones, UA: NEGATIVE
Leukocytes, UA: NEGATIVE
NITRITE UA: NEGATIVE
PH UA: 5.5
PROTEIN UA: NEGATIVE
Spec Grav, UA: 1.015
UROBILINOGEN UA: 0.2

## 2014-05-27 LAB — CBC WITH DIFFERENTIAL/PLATELET
Basophils Absolute: 0.1 10*3/uL (ref 0.0–0.1)
Basophils Relative: 0.6 % (ref 0.0–3.0)
EOS PCT: 1.9 % (ref 0.0–5.0)
Eosinophils Absolute: 0.2 10*3/uL (ref 0.0–0.7)
HEMATOCRIT: 46.5 % — AB (ref 36.0–46.0)
HEMOGLOBIN: 15.8 g/dL — AB (ref 12.0–15.0)
LYMPHS ABS: 2.3 10*3/uL (ref 0.7–4.0)
Lymphocytes Relative: 21.2 % (ref 12.0–46.0)
MCHC: 33.9 g/dL (ref 30.0–36.0)
MCV: 92.5 fl (ref 78.0–100.0)
Monocytes Absolute: 0.7 10*3/uL (ref 0.1–1.0)
Monocytes Relative: 6.4 % (ref 3.0–12.0)
NEUTROS ABS: 7.4 10*3/uL (ref 1.4–7.7)
Neutrophils Relative %: 69.9 % (ref 43.0–77.0)
Platelets: 335 10*3/uL (ref 150.0–400.0)
RBC: 5.03 Mil/uL (ref 3.87–5.11)
RDW: 13 % (ref 11.5–15.5)
WBC: 10.6 10*3/uL — AB (ref 4.0–10.5)

## 2014-05-27 LAB — LIPID PANEL
Cholesterol: 214 mg/dL — ABNORMAL HIGH (ref 0–200)
HDL: 41.3 mg/dL (ref >39.00)
NONHDL: 172.7
Total CHOL/HDL Ratio: 5
Triglycerides: 202 mg/dL — ABNORMAL HIGH (ref 0.0–149.0)
VLDL: 40.4 mg/dL — ABNORMAL HIGH (ref 0.0–40.0)

## 2014-05-27 LAB — HEPATIC FUNCTION PANEL
ALBUMIN: 3.9 g/dL (ref 3.5–5.2)
ALT: 19 U/L (ref 0–35)
AST: 19 U/L (ref 0–37)
Alkaline Phosphatase: 71 U/L (ref 39–117)
Bilirubin, Direct: 0 mg/dL (ref 0.0–0.3)
Total Bilirubin: 0.6 mg/dL (ref 0.2–1.2)
Total Protein: 7.3 g/dL (ref 6.0–8.3)

## 2014-05-27 LAB — LDL CHOLESTEROL, DIRECT: LDL DIRECT: 128.2 mg/dL

## 2014-05-27 LAB — TSH: TSH: 1.75 u[IU]/mL (ref 0.35–4.50)

## 2014-06-03 ENCOUNTER — Ambulatory Visit (INDEPENDENT_AMBULATORY_CARE_PROVIDER_SITE_OTHER): Payer: 59 | Admitting: Family Medicine

## 2014-06-03 ENCOUNTER — Encounter: Payer: Self-pay | Admitting: Family Medicine

## 2014-06-03 VITALS — BP 120/80 | Temp 98.2°F | Ht 61.25 in | Wt 190.0 lb

## 2014-06-03 DIAGNOSIS — Z0000 Encounter for general adult medical examination without abnormal findings: Secondary | ICD-10-CM

## 2014-06-03 DIAGNOSIS — I10 Essential (primary) hypertension: Secondary | ICD-10-CM

## 2014-06-03 DIAGNOSIS — F329 Major depressive disorder, single episode, unspecified: Secondary | ICD-10-CM

## 2014-06-03 DIAGNOSIS — F32A Depression, unspecified: Secondary | ICD-10-CM

## 2014-06-03 MED ORDER — CITALOPRAM HYDROBROMIDE 20 MG PO TABS: 20.0000 mg | ORAL_TABLET | Freq: Every day | ORAL | Status: DC

## 2014-06-03 NOTE — Patient Instructions (Signed)
Celexa 20 mg,,,,,, one daily at bedtime  Continue diet and exercise program  Follow-up in 1 year sooner if any problems  Do BSE monthly, call and get set up for your mammogram, also call Dr. Hillis Range ophthalmologist for night exam

## 2014-06-03 NOTE — Progress Notes (Signed)
Pre visit review using our clinic review tool, if applicable. No additional management support is needed unless otherwise documented below in the visit note. 

## 2014-06-03 NOTE — Progress Notes (Signed)
Subjective:    Patient ID: Julia Norman, female    DOB: 1950/12/17, 63 y.o.   MRN: [id number]  HPI Julia Norman is a delightful 63 year old single female nonsmoker who comes in today for general physical examination  Last year she weighed 211 pounds now she is down to 190. She states she's done that with a combination of exercise and stress. A lot of stress at work. She is a Therapist, occupational for [employer]. She is walking 2 miles daily  She has a history of mild depression. She would like to restart her Celexa 20 mg daily  Her blood pressure is 120/80. She did have a history of elevated blood pressure but now it's been normal.  Vaccinations up-to-date except she needs a shingles vaccine.  She does not get routine eye care and each she's having trouble with near and far vision. Referred to Dr. Bing Plume, regular dental care, she does not do BSE monthly, last mammogram was 2004. Recommended BSE monthly and annual mammography. Colonoscopy-[place] 8 years ago normal.  She recently saw her dermatologist and had a lesion removed. She's had a history of squamous cell carcinoma in the past. She has light skin and light eyes.  Pelvic and Pap done May 2014 all normal no history of any previous problems therefore recommend pelvic and Pap every 3 years.   Review of Systems  Constitutional: Negative.   HENT: Negative.   Eyes: Negative.   Respiratory: Negative.   Cardiovascular: Negative.   Gastrointestinal: Negative.   Endocrine: Negative.   Genitourinary: Negative.   Musculoskeletal: Negative.   Skin: Negative.   Allergic/Immunologic: Negative.   Neurological: Negative.   Hematological: Negative.   Psychiatric/Behavioral: Negative.        Objective:   Physical Exam  Constitutional: She appears well-developed and well-nourished.  HENT:  Head: Normocephalic and atraumatic.  Right Ear: External ear normal.  Left Ear: External ear normal.  Nose: Nose normal.    Mouth/Throat: Oropharynx is clear and moist.  Eyes: EOM are normal. Pupils are equal, round, and reactive to light.  Neck: Normal range of motion. Neck supple. No JVD present. No tracheal deviation present. No thyromegaly present.  Cardiovascular: Normal rate, regular rhythm, normal heart sounds and intact distal pulses.  Exam reveals no gallop and no friction rub.   No murmur heard. No carotid nor aortic bruits peripheral pulses 1+ and symmetrical  Pulmonary/Chest: Effort normal and breath sounds normal. No stridor. No respiratory distress. She has no wheezes. She has no rales. She exhibits no tenderness.  Abdominal: Soft. Bowel sounds are normal. She exhibits no distension and no mass. There is no tenderness. There is no rebound and no guarding.  Genitourinary:  Bilateral breast exam normal encouraged BSE monthly and annual mammography  Pelvic and Pap last year normal therefore not repeated since she's been asymptomatic and is in the low risk category would recommend pelvic and Pap every 3 years.  Musculoskeletal: Normal range of motion.  Lymphadenopathy:    She has no cervical adenopathy.  Neurological: She is alert. She has normal reflexes. No cranial nerve deficit. She exhibits normal muscle tone. Coordination normal.  Skin: Skin is warm and dry. No rash noted. No erythema. No pallor.  Total body skin exam normal except for scars from previous lesions removed.  Psychiatric: She has a normal mood and affect. Her behavior is normal. Judgment and thought content normal.  Nursing note and vitals reviewed.         Assessment & Plan:  History of mild depression,,,,, restart Celexa 20 mg daily  Overweight ,,,,, weight down 21 pounds in 12 months via diet and exercise ,,,,,, continue that process  ,,,,,,,,  CBC shows elevated hemoglobin....... smoking a pack of cigarettes a week......... advised to stop smoking completely and go to the [facility] every 3 months for blood donation and  follow-up CBC in one year

## 2014-06-04 ENCOUNTER — Telehealth: Payer: Self-pay | Admitting: Family Medicine

## 2014-06-04 NOTE — Telephone Encounter (Signed)
emmi mailed  °

## 2014-06-04 NOTE — Telephone Encounter (Signed)
emmi emailed °

## 2014-10-02 ENCOUNTER — Telehealth: Payer: Self-pay | Admitting: Family Medicine

## 2014-10-02 NOTE — Telephone Encounter (Signed)
Spoke with patient and she is doing "much better".

## 2014-10-02 NOTE — Telephone Encounter (Signed)
[place] [facility] - Client [facility] Patient Name: Julia Norman DOB: 05-23-1951 Initial Comment caller states she went to an urgent care for a sinus infection - was given prednisone - yesterday started to feel dizzy and lightheaded and has hives - this am the hives have gone away , but is still dizzy Nurse Assessment Nurse: Markus Daft, RN, Sherre Poot Date/Time ([place] Time): 10/02/2014 9:09:32 AM Confirm and document reason for call. If symptomatic, describe symptoms. ---Caller states she went to an urgent care 09/25/14 for a sinus infection and bronchitis. She was given prednisone 20 mg BID x 5 days (has missed a few times the second dose, and so has one tab left), and Doxycycline 100 mg BID for 10 days (she has 8 left). - Yesterday, started to feel agitated, and then felt loupy and could not find the words that she wanted to say when her friend asked her questions. She also felt lightheaded. Today she woke up and had itchy hives on her back and tiny pinkish red spots/ dots on her abdomen. This am the hives/rash have gone now, but is stil dizzy/hyperactive - "jumpy" feeling. She still has a productive cough, mild now with clear phlegm, and much less nasal congestion. No fever. Has the patient traveled out of the country within the last 30 days? ---Not Applicable Does the patient require triage? ---Yes Related visit to physician within the last 2 weeks? ---Yes Does the PT have any chronic conditions? (i.e. diabetes, asthma, etc.) ---No Guidelines Guideline Title Affirmed Question Affirmed Notes Sinus Infection on Antibiotic Follow-up Call [1] Reasonable improvement on antibiotic AND [2] no fever or pain (all triage questions negative) Medication Question Call Caller has NON-URGENT medication question about med that PCP prescribed and triager unable to answer question Final Disposition User Call PCP within 24 Hours [place],  [place], [place] Comments Per [email or web address], rash could be r/t antibiotic, and other s/s could be r/t the prednisone. Will verify with MD if he wants her to cont. treatment or stop it since she has noticed reasonable improvement. Caller aware to call back if she worsens in anyway, and verb. understanding. Office: Please call pt back with MD's response.

## 2014-10-02 NOTE — Telephone Encounter (Signed)
Please see message and advise 

## 2014-10-17 ENCOUNTER — Ambulatory Visit (INDEPENDENT_AMBULATORY_CARE_PROVIDER_SITE_OTHER): Payer: 59 | Admitting: Family Medicine

## 2014-10-17 ENCOUNTER — Telehealth: Payer: Self-pay | Admitting: Family Medicine

## 2014-10-17 ENCOUNTER — Ambulatory Visit (INDEPENDENT_AMBULATORY_CARE_PROVIDER_SITE_OTHER)
Admission: RE | Admit: 2014-10-17 | Discharge: 2014-10-17 | Disposition: A | Discharge status: Home / Self Care | Payer: 59 | Source: Ambulatory Visit | Attending: Family Medicine | Admitting: Family Medicine

## 2014-10-17 ENCOUNTER — Encounter: Payer: Self-pay | Admitting: Family Medicine

## 2014-10-17 VITALS — BP 120/84 | Temp 98.4°F | Wt 198.0 lb

## 2014-10-17 DIAGNOSIS — M79605 Pain in left leg: Principal | ICD-10-CM

## 2014-10-17 DIAGNOSIS — M545 Low back pain, unspecified: Secondary | ICD-10-CM

## 2014-10-17 MED ORDER — TRAMADOL HCL 50 MG PO TABS: 50.0000 mg | ORAL_TABLET | Freq: Three times a day (TID) | ORAL | Status: DC | PRN

## 2014-10-17 MED ORDER — CYCLOBENZAPRINE HCL 5 MG PO TABS: ORAL_TABLET | ORAL | Status: DC

## 2014-10-17 NOTE — Telephone Encounter (Signed)
Julia Norman will speak to Dr. Sherren Mocha to get pt worked in either with him or another provider. Note forwarded to [facility]

## 2014-10-17 NOTE — Patient Instructions (Signed)
After tick bite monitor your symptoms for 2 weeks...Marland KitchenMarland KitchenMarland Kitchen [facility] spotted fever presents with fever chills and a bad headache  Lines disease can take longer to occur........ Bittick causes severe muscle pain joint pain and lethargy  You have what's termed mechanical low back pain..... Irritation of the nerve root between the fourth and fifth lumbar disc....... the treatment of which is Motrin 600 mg twice daily with food,,,,,,,,, Flexeril and tramadol at bedtime for pain,,,,,,, walking 30 minutes daily,,,,,,,,, avoid sitting  If your pain is severe you may go to bed rest for 2 days  All your lab work in December her thyroid etc. was all normal. However because of the symptoms of shortness of breath with get a chest x-ray

## 2014-10-17 NOTE — Progress Notes (Signed)
Pre visit review using our clinic review tool, if applicable. No additional management support is needed unless otherwise documented below in the visit note. 

## 2014-10-17 NOTE — Telephone Encounter (Signed)
Patient Name: Julia Norman DOB: [birth year] Initial Comment Caller states c/o sweats, red bump where she found a tick Nurse Assessment Nurse: Vallery Sa, RN, Cathy Date/Time (Eastern Time): 10/17/2014 8:43:04 AM Confirm and document reason for call. If symptomatic, describe symptoms. ---Caller states she had a tick bite on her stomach that became red, swollen about 1-2 weeks ago. She just developed pain in her legs about 3-4 days ago that is worse on the left side. No fever. Has the patient traveled out of the country within the last 30 days? ---No Does the patient require triage? ---Yes Related visit to physician within the last 2 weeks? ---Yes Does the PT have any chronic conditions? (i.e. diabetes, asthma, etc.) ---No Guidelines Guideline Title Affirmed Question Affirmed Notes Tick Bite [1] 2 to 14 days following tick bite AND [2] widespread rash or headache AND [3] no fever Leg Pain Difficulty breathing short of breath Final Disposition User Go to ED Now [place], RN, [place] shared that she developed shortness of breath several days ago that is especially worse when she exerts herself. Called the office backline and notified [name]. She will notify MD and have someone call [name] back.

## 2014-10-17 NOTE — Progress Notes (Signed)
   Subjective:    Patient ID: Julia Norman, female    DOB: [birth year], 64 y.o.   MRN: [id number]  HPI Julia Norman is a 64 year old female who comes in today for evaluation of multiple issues  She tells me on April 2 she went to an urgent care and aspirin. She was told she had bronchitis and sinusitis. Her symptoms were head congestion runny nose cough and wheezing. She's been next smoker now for 2 months. Historically she had no symptoms of sinusitis. She was given doxycycline 100 mg twice a day which is not the appropriate drug for sinusitis anyway. The thing that helped her was the prednisone. They gave her 20 mg twice a day for 10 days. Now she feels her airways her back to normal  2 weeks ago she noticed a tick on her right lower abdominal area. It left a bite mark.  We spent a long time discussing [facility] spotted fever and Lyme disease. Neither of which she has any symptoms of.  She's also complaining of low back pain. She has about a week ago she was getting up and noticed pain in the left side of her back. It's constant radiates down to her left calf dull initially was a 6 now for 3. No history of trauma.  She is markedly overweight for her height. She says she has no energy and has shortness of breath with exertion   Review of Systems Review of systems otherwise negative    Objective:   Physical Exam  Well-developed well-nourished female no acute distress vital signs stable she's afebrile examination of the abdomen shows a small 2 mm tick bite.  Examination spine was normal. In the supine position both legs reveal equal length. Sensation muscle strength reflexes all within normal limits.  Lung exam clear no wheezing      Assessment & Plan:  Viral syndrome with secondary wheezing,,,,,,,, resolve with prednisone,,,,,, antibiotics were not indicated  Low back pain,,,,,,,,,,, L4-L5 distribution,,,,,,,,, outlined program of exercise,,,,,, and medication,,,,,,,, and weight  loss  Tick bite 2 weeks ago,,,,,,,, no symptoms of locking non-spotted fever nor Lyme's disease

## 2014-10-17 NOTE — Telephone Encounter (Signed)
An appointment made with Dr Sherren Mocha.

## 2014-11-01 ENCOUNTER — Telehealth: Payer: Self-pay | Admitting: *Deleted

## 2014-11-01 NOTE — Telephone Encounter (Signed)
Patient states that she will have her mammogram done at work and will call or fax results

## 2015-03-05 ENCOUNTER — Other Ambulatory Visit: Payer: Self-pay

## 2015-03-05 DIAGNOSIS — Z1231 Encounter for screening mammogram for malignant neoplasm of breast: Secondary | ICD-10-CM

## 2015-03-12 ENCOUNTER — Ambulatory Visit: Payer: 59

## 2015-03-18 ENCOUNTER — Ambulatory Visit: Payer: 59

## 2015-10-08 ENCOUNTER — Ambulatory Visit (INDEPENDENT_AMBULATORY_CARE_PROVIDER_SITE_OTHER)
Admission: RE | Admit: 2015-10-08 | Discharge: 2015-10-08 | Disposition: A | Discharge status: Home / Self Care | Payer: 59 | Source: Ambulatory Visit | Attending: Adult Health | Admitting: Adult Health

## 2015-10-08 ENCOUNTER — Other Ambulatory Visit: Payer: Self-pay | Admitting: Adult Health

## 2015-10-08 ENCOUNTER — Ambulatory Visit (INDEPENDENT_AMBULATORY_CARE_PROVIDER_SITE_OTHER): Payer: 59 | Admitting: Adult Health

## 2015-10-08 DIAGNOSIS — J069 Acute upper respiratory infection, unspecified: Secondary | ICD-10-CM

## 2015-10-08 MED ORDER — HYDROCODONE-HOMATROPINE 5-1.5 MG/5ML PO SYRP: 5.0000 mL | ORAL_SOLUTION | Freq: Three times a day (TID) | ORAL | Status: DC | PRN

## 2015-10-08 MED ORDER — DOXYCYCLINE HYCLATE 100 MG PO CAPS: 100.0000 mg | ORAL_CAPSULE | Freq: Two times a day (BID) | ORAL | Status: DC

## 2015-10-08 MED ORDER — PREDNISONE 10 MG PO TABS: 10.0000 mg | ORAL_TABLET | Freq: Every day | ORAL | Status: DC

## 2015-10-08 NOTE — Patient Instructions (Signed)
I will follow up with you once I get the xray back.   Use the cough syrup at night.   During the day use Mucinex DM

## 2015-10-08 NOTE — Progress Notes (Signed)
Subjective:    Patient ID: Julia Norman, female    DOB: 1951-04-17, 65 y.o.   MRN: [id number]  Cough This is a new problem. The current episode started in the past 7 days. The cough is productive of sputum. Associated symptoms include chills, a fever, nasal congestion, postnasal drip, rhinorrhea and wheezing. Pertinent negatives include no ear congestion, ear pain or sore throat. There is no history of asthma or pneumonia.  URI  The current episode started in the past 7 days. The problem has been gradually worsening. The maximum temperature recorded prior to her arrival was 101 - 101.9 F. Associated symptoms include congestion, coughing (productive ), rhinorrhea, sinus pain and wheezing. Pertinent negatives include no ear pain or sore throat. She has tried decongestant, NSAIDs and acetaminophen for the symptoms. The treatment provided no relief.      Review of Systems  Constitutional: Positive for fever, chills, diaphoresis, activity change and fatigue.  HENT: Positive for congestion, postnasal drip and rhinorrhea. Negative for ear pain and sore throat.   Respiratory: Positive for cough (productive ) and wheezing.   Neurological: Negative.    Past Medical History  Diagnosis Date  . Cancer     skin  . Kidney stones     Social History   Social History  . Marital Status: Married    Spouse Name: N/A  . Number of Children: N/A  . Years of Education: N/A   Occupational History  . Not on file.   Social History Main Topics  . Smoking status: Current Some Day Smoker  . Smokeless tobacco: Not on file  . Alcohol Use: Yes  . Drug Use: No  . Sexual Activity: Not on file   Other Topics Concern  . Not on file   Social History Narrative    Past Surgical History  Procedure Laterality Date  . Cholecystectomy      Family History  Problem Relation Age of Onset  . Cancer Mother   . Depression Mother   . Gallbladder disease Mother   . Heart disease Father     Allergies    Allergen Reactions  . Codeine     Current Outpatient Prescriptions on File Prior to Visit  Medication Sig Dispense Refill  . cyclobenzaprine (FLEXERIL) 5 MG tablet One by mouth daily at bedtime (Patient not taking: Reported on 10/08/2015) 30 tablet 1  . traMADol (ULTRAM) 50 MG tablet Take 1 tablet (50 mg total) by mouth every 8 (eight) hours as needed. (Patient not taking: Reported on 10/08/2015) 30 tablet 1   No current facility-administered medications on file prior to visit.    BP 122/70 mmHg  Pulse 74  Temp(Src) 99.5 F (37.5 C) (Oral)  Wt 201 lb 12.8 oz (91.536 kg)  SpO2 92%       Objective:   Physical Exam  Constitutional: She appears well-developed and well-nourished. No distress.  Cardiovascular: Normal rate, regular rhythm, normal heart sounds and intact distal pulses.  Exam reveals no friction rub.   No murmur heard. Pulmonary/Chest: Effort normal. She has wheezes in the right upper field, the right middle field, the right lower field, the left upper field, the left middle field and the left lower field. She has rhonchi in the right upper field, the right middle field and the left middle field. She has no rales.  Skin: She is not diaphoretic.  Nursing note and vitals reviewed.     Assessment & Plan:  1. URI (upper respiratory infection) - Need to  r/o pneumonia. She has wheezing, rhonchi, fevers, and generalized body aches.  - DG Chest 2 View; Future - HYDROcodone-homatropine (HYCODAN) 5-1.5 MG/5ML syrup; Take 5 mLs by mouth every 8 (eight) hours as needed for cough.  Dispense: 120 mL; Refill: 0 - Will treat depending on chest x ray results.   Dorothyann Peng, NP

## 2015-11-13 ENCOUNTER — Telehealth: Payer: Self-pay | Admitting: Family Medicine

## 2015-11-13 NOTE — Telephone Encounter (Signed)
Pt states  [facility] needs a Rx for her hearing aids. Dr Sherren Mocha referred her to them, and pt  states they have sent request, and will resend again today.

## 2015-11-14 ENCOUNTER — Encounter: Payer: Self-pay | Admitting: *Deleted

## 2015-11-14 DIAGNOSIS — H9190 Unspecified hearing loss, unspecified ear: Secondary | ICD-10-CM | POA: Insufficient documentation

## 2015-11-14 NOTE — Telephone Encounter (Signed)
Order filled out waiting on providers signature.

## 2015-11-18 NOTE — Telephone Encounter (Signed)
Fax has been sent

## 2015-12-17 LAB — HM MAMMOGRAPHY

## 2015-12-25 ENCOUNTER — Encounter: Payer: Self-pay | Admitting: Family Medicine

## 2016-05-18 ENCOUNTER — Other Ambulatory Visit: Payer: 59

## 2016-05-20 ENCOUNTER — Other Ambulatory Visit (INDEPENDENT_AMBULATORY_CARE_PROVIDER_SITE_OTHER): Payer: 59

## 2016-05-20 DIAGNOSIS — Z0000 Encounter for general adult medical examination without abnormal findings: Secondary | ICD-10-CM

## 2016-05-20 LAB — CBC WITH DIFFERENTIAL/PLATELET
BASOS ABS: 0 10*3/uL (ref 0.0–0.1)
Basophils Relative: 0.5 % (ref 0.0–3.0)
EOS PCT: 1.8 % (ref 0.0–5.0)
Eosinophils Absolute: 0.2 10*3/uL (ref 0.0–0.7)
HCT: 44.6 % (ref 36.0–46.0)
HEMOGLOBIN: 15.3 g/dL — AB (ref 12.0–15.0)
LYMPHS ABS: 2.6 10*3/uL (ref 0.7–4.0)
Lymphocytes Relative: 26.2 % (ref 12.0–46.0)
MCHC: 34.3 g/dL (ref 30.0–36.0)
MCV: 92.5 fl (ref 78.0–100.0)
MONOS PCT: 8.2 % (ref 3.0–12.0)
Monocytes Absolute: 0.8 10*3/uL (ref 0.1–1.0)
NEUTROS PCT: 63.3 % (ref 43.0–77.0)
Neutro Abs: 6.2 10*3/uL (ref 1.4–7.7)
Platelets: 297 10*3/uL (ref 150.0–400.0)
RBC: 4.82 Mil/uL (ref 3.87–5.11)
RDW: 13.2 % (ref 11.5–15.5)
WBC: 9.9 10*3/uL (ref 4.0–10.5)

## 2016-05-20 LAB — HEPATIC FUNCTION PANEL
ALK PHOS: 71 U/L (ref 39–117)
ALT: 18 U/L (ref 0–35)
AST: 18 U/L (ref 0–37)
Albumin: 4.2 g/dL (ref 3.5–5.2)
BILIRUBIN TOTAL: 0.4 mg/dL (ref 0.2–1.2)
Bilirubin, Direct: 0.1 mg/dL (ref 0.0–0.3)
Total Protein: 7.2 g/dL (ref 6.0–8.3)

## 2016-05-20 LAB — LIPID PANEL
CHOLESTEROL: 173 mg/dL (ref 0–200)
HDL: 41.9 mg/dL (ref >39.00)
LDL CALC: 109 mg/dL — AB (ref 0–99)
NonHDL: 131.42
Total CHOL/HDL Ratio: 4
Triglycerides: 111 mg/dL (ref 0.0–149.0)
VLDL: 22.2 mg/dL (ref 0.0–40.0)

## 2016-05-20 LAB — POC URINALSYSI DIPSTICK (AUTOMATED)
GLUCOSE UA: NEGATIVE
LEUKOCYTES UA: NEGATIVE
Nitrite, UA: NEGATIVE
PH UA: 5.5
Protein, UA: NEGATIVE
Spec Grav, UA: 1.02
Urobilinogen, UA: 0.2

## 2016-05-20 LAB — BASIC METABOLIC PANEL
BUN: 15 mg/dL (ref 6–23)
CALCIUM: 9.9 mg/dL (ref 8.4–10.5)
CO2: 27 mEq/L (ref 19–32)
Chloride: 104 mEq/L (ref 96–112)
Creatinine, Ser: 0.74 mg/dL (ref 0.40–1.20)
GFR: 83.78 mL/min (ref >60.00)
GLUCOSE: 87 mg/dL (ref 70–99)
POTASSIUM: 4.1 meq/L (ref 3.5–5.1)
SODIUM: 138 meq/L (ref 135–145)

## 2016-05-20 LAB — TSH: TSH: 2.48 u[IU]/mL (ref 0.35–4.50)

## 2016-05-24 ENCOUNTER — Ambulatory Visit (INDEPENDENT_AMBULATORY_CARE_PROVIDER_SITE_OTHER): Payer: 59 | Admitting: Family Medicine

## 2016-05-24 ENCOUNTER — Other Ambulatory Visit (HOSPITAL_COMMUNITY)
Admission: RE | Admit: 2016-05-24 | Discharge: 2016-05-24 | Disposition: A | Discharge status: Home / Self Care | Payer: 59 | Source: Ambulatory Visit | Attending: Family Medicine | Admitting: Family Medicine

## 2016-05-24 ENCOUNTER — Encounter: Payer: Self-pay | Admitting: Family Medicine

## 2016-05-24 VITALS — BP 148/90 | HR 93 | Temp 98.2°F | Ht 61.0 in | Wt 181.3 lb

## 2016-05-24 DIAGNOSIS — Z720 Tobacco use: Secondary | ICD-10-CM

## 2016-05-24 DIAGNOSIS — Z124 Encounter for screening for malignant neoplasm of cervix: Secondary | ICD-10-CM

## 2016-05-24 DIAGNOSIS — Z0001 Encounter for general adult medical examination with abnormal findings: Secondary | ICD-10-CM | POA: Diagnosis not present

## 2016-05-24 DIAGNOSIS — E663 Overweight: Secondary | ICD-10-CM | POA: Diagnosis not present

## 2016-05-24 DIAGNOSIS — M542 Cervicalgia: Secondary | ICD-10-CM

## 2016-05-24 DIAGNOSIS — R1031 Right lower quadrant pain: Secondary | ICD-10-CM

## 2016-05-24 DIAGNOSIS — H9190 Unspecified hearing loss, unspecified ear: Secondary | ICD-10-CM | POA: Diagnosis not present

## 2016-05-24 DIAGNOSIS — Z01419 Encounter for gynecological examination (general) (routine) without abnormal findings: Secondary | ICD-10-CM | POA: Diagnosis present

## 2016-05-24 DIAGNOSIS — Z0000 Encounter for general adult medical examination without abnormal findings: Secondary | ICD-10-CM

## 2016-05-24 NOTE — Patient Instructions (Addendum)
Continue the diet exercise and weight loss  Follow-up in one year sooner if any problems. Add walking 30 minutes daily for your overall health and for bone health.  We would recommend a bone density

## 2016-05-24 NOTE — Progress Notes (Signed)
Pre visit review using our clinic review tool, if applicable. No additional management support is needed unless otherwise documented below in the visit note. 

## 2016-05-24 NOTE — Progress Notes (Signed)
Julia Norman is a 65 year old female smoker...... 2 cigarettes per day...Marland KitchenMarland KitchenMarland Kitchen who comes in today for general physical examination  Her biggest problem has been her weight. She went on a diet.. Weight watchers.. And has lost 21 pounds. She's down to 181. Goal is in the 160-150 range.  She occasionally gets pain in her neck she points left side her neck and goes down her left arm last for about a day and goes away it relieved with some Motrin. 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  She also has distant discomfort in her right lower quadrant it's been there for about 3 months. Says it happens occasionally last for a day or half a day and goes away. She has no fever chills nausea vomiting diarrhea.  LMP was 10 years ago she's never had trouble with her pelvic or Paps.  She gets routine eye care, dental care, BSE monthly, annual mammography, colonoscopy done in [facility] within 10 years it was normal.  Vaccinations flu shot given at work she'll inquire where she can get the shingles vaccine.  Advise cystoscopy smoking completely  Medications none  s. BP (!) 148/90 (BP Location: Left Arm, Patient Position: Sitting, Cuff Size: Normal)   Pulse 93   Temp 98.2 F (36.8 C) (Oral)   Ht 5\' 1"  (1.549 m)   Wt 181 lb 4.8 oz (82.2 kg)   LMP 06/23/2005 (Approximate) Comment: 10 years ago   SpO2 97%   BMI 34.26 kg/m  Examination of the HEENT were negative neck was supple no adenopathy thyroid normal no carotid bruits cardiopulmonary exam normal breast exam normal abdominal exam scar right upper quadrant from previous cholecystectomy otherwise abdominal exam was negative. Pelvic examination external genitalia within normal limits vaginal vault was normal cervix is visualized Pap smear was done bimanual exam was negative rectal normal stool guaiac negative extremities normal skin normal peripheral pulses normal except for scars where she's had 45 skin cancers removed. The last  was done by Dr. Allyson Sabal. She has a new lesion on her left wrist which appears to be actinic keratosis. Advised to have that removed ASAP by one of [place].  Impression healthy female  #2 overweight ......... continue diet exercise and weight loss  #3 cervical neck pain........ sounds like she's got a pinched nerve that comes and goes. Advised the pain gets worse or develops any neurologic symptoms we'll evaluate it.  #4 right lower quadrant pain unknown etiology....... again advised if it becomes persistent pain gets worse anything like that to get back with [place] and will pursue it.  #5 tobacco abuse.......... smokes 2 cigarettes per day.....Marland Kitchen advised to quit completely  #6 bilateral hearing loss loss....... continue to wear her hearing aids.

## 2016-05-25 LAB — CYTOLOGY - PAP: Diagnosis: NEGATIVE

## 2016-06-01 ENCOUNTER — Ambulatory Visit (INDEPENDENT_AMBULATORY_CARE_PROVIDER_SITE_OTHER)
Admission: RE | Admit: 2016-06-01 | Discharge: 2016-06-01 | Disposition: A | Discharge status: Home / Self Care | Payer: 59 | Source: Ambulatory Visit | Attending: Family Medicine | Admitting: Family Medicine

## 2016-06-01 DIAGNOSIS — Z0000 Encounter for general adult medical examination without abnormal findings: Secondary | ICD-10-CM | POA: Diagnosis not present

## 2017-05-31 ENCOUNTER — Ambulatory Visit: Payer: 59 | Admitting: Family Medicine

## 2017-06-01 ENCOUNTER — Ambulatory Visit (INDEPENDENT_AMBULATORY_CARE_PROVIDER_SITE_OTHER): Payer: 59 | Admitting: Family Medicine

## 2017-06-01 ENCOUNTER — Encounter: Payer: Self-pay | Admitting: Family Medicine

## 2017-06-01 VITALS — BP 110/56 | HR 74 | Temp 98.1°F | Ht 61.0 in | Wt 145.0 lb

## 2017-06-01 DIAGNOSIS — H9190 Unspecified hearing loss, unspecified ear: Secondary | ICD-10-CM

## 2017-06-01 DIAGNOSIS — Z23 Encounter for immunization: Secondary | ICD-10-CM | POA: Diagnosis not present

## 2017-06-01 DIAGNOSIS — Z0000 Encounter for general adult medical examination without abnormal findings: Secondary | ICD-10-CM | POA: Diagnosis not present

## 2017-06-01 LAB — POCT URINALYSIS DIPSTICK
Bilirubin, UA: NEGATIVE
Glucose, UA: NEGATIVE
Ketones, UA: NEGATIVE
LEUKOCYTES UA: NEGATIVE
NITRITE UA: NEGATIVE
PROTEIN UA: NEGATIVE
SPEC GRAV UA: 1.025 (ref 1.010–1.025)
Urobilinogen, UA: 0.2 E.U./dL
pH, UA: 6 (ref 5.0–8.0)

## 2017-06-01 NOTE — Progress Notes (Signed)
Celest is a delightful 66 year old female nonsmoker,,,,,,, who works at the [employer],,,,,,,,,, who comes in today for annual physical examination  Her biggest achievement is weight loss. She's lost over 60 pounds in the past year via [employer].  She takes no medication on a regular basis  She gets routine eye care,,,,,,,,, recently right cataract removed left still there,,, regular dental care, BSE monthly, and you mammography at work portable unit, colonoscopy was over 10 years ago. She received a recall notice but never called to have her follow-up colonoscopy. GI review of systems negative no constipation bleeding etc. Last Pap was year ago normal. No history of any abnormal Paps. Therefore recommend every 3 years.  Vaccinations up-to-date except she's doing Pneumovax.  She's got a lesion on her right forearm and her left foot that need to be addressed. She has light skin and lines eyes and has had skin cancer in the past.  No significant hearing loss last year. Her refer to audiology. She has bilateral hearing aids now that is markedly improved her hearing.  BP (!) 110/56 (BP Location: Left Arm, Patient Position: Sitting, Cuff Size: Normal)   Pulse 74   Temp 98.1 F (36.7 C) (Oral)   Ht 5\' 1"  (1.549 m)   Wt 145 lb (65.8 kg)   LMP 06/23/2005 (Approximate) Comment: 10 years ago   BMI 27.40 kg/m  She is a well-developed well-nourished female no acute distress examination HEENT is pertinent right cataract was removed and she has a clear lens implant. Left cataract is very dense. Neck was supple thyroid not enlarged no carotid bruits. Cardiopulmonary exam normal breast exam left breast is normal. Is significant thickening the right breast through all 4 quadrants. Information was given on monthly BSE and annual mammography at the breast center. Abdominal exam negative pelvic and rectal deferred extremities normal skin normal peripheral pulses normal except for 2 lesions. #1  his right forearm appears to be an actinic keratosis. Advised to come back for removal. #2 is a lesion on her left foot gets a corn. Information given on OTC therapy  #1 healthy female  #2 significant weight loss via [employer] of total now 60 pounds.......... continue diet exercise maintain weight at 145  #3 thickening right breast...Marland KitchenMarland KitchenMarland Kitchen thorough BSE instructions............ and you mammography at the breast center.  #4 hearing loss........ corrected by hearing aids

## 2017-06-01 NOTE — Patient Instructions (Addendum)
Continue diet and exercise program............. your current weight at 145 is excellent and I would recommend you maintain that. You can relay that to the folks at [employer]  Your right cataract has been removed,,,,,,,,,,,,, left is extremely dense,,,,, I would recommend you follow-up with your ophthalmologist this winter As we discussed you have significant thickening in that right breast. I would therefore recommend a thorough breast exam monthly at home and a comprehensive mammogram including possibly a 3-D mammogram at the breast center.  Follow-up in one year sooner if any problems  We will also get you set up for screening colonoscopy. If you wish to get done in [place]......... talk to gastroenterologist about that  Return next week to treat the 2 lesions we discussed

## 2017-06-02 LAB — CBC WITH DIFFERENTIAL/PLATELET
BASOS PCT: 0.6 % (ref 0.0–3.0)
Basophils Absolute: 0.1 10*3/uL (ref 0.0–0.1)
EOS ABS: 0.2 10*3/uL (ref 0.0–0.7)
Eosinophils Relative: 2.2 % (ref 0.0–5.0)
HCT: 45.9 % (ref 36.0–46.0)
Hemoglobin: 15.5 g/dL — ABNORMAL HIGH (ref 12.0–15.0)
LYMPHS ABS: 2.5 10*3/uL (ref 0.7–4.0)
LYMPHS PCT: 28.6 % (ref 12.0–46.0)
MCHC: 33.8 g/dL (ref 30.0–36.0)
MCV: 95.3 fl (ref 78.0–100.0)
Monocytes Absolute: 0.8 10*3/uL (ref 0.1–1.0)
Monocytes Relative: 9.4 % (ref 3.0–12.0)
NEUTROS ABS: 5.1 10*3/uL (ref 1.4–7.7)
NEUTROS PCT: 59.2 % (ref 43.0–77.0)
PLATELETS: 290 10*3/uL (ref 150.0–400.0)
RBC: 4.81 Mil/uL (ref 3.87–5.11)
RDW: 13.2 % (ref 11.5–15.5)
WBC: 8.7 10*3/uL (ref 4.0–10.5)

## 2017-06-02 LAB — HEPATIC FUNCTION PANEL
ALK PHOS: 69 U/L (ref 39–117)
ALT: 15 U/L (ref 0–35)
AST: 16 U/L (ref 0–37)
Albumin: 4.2 g/dL (ref 3.5–5.2)
BILIRUBIN DIRECT: 0.1 mg/dL (ref 0.0–0.3)
BILIRUBIN TOTAL: 0.5 mg/dL (ref 0.2–1.2)
TOTAL PROTEIN: 7 g/dL (ref 6.0–8.3)

## 2017-06-02 LAB — BASIC METABOLIC PANEL
BUN: 15 mg/dL (ref 6–23)
CALCIUM: 9.5 mg/dL (ref 8.4–10.5)
CO2: 30 meq/L (ref 19–32)
Chloride: 102 mEq/L (ref 96–112)
Creatinine, Ser: 0.69 mg/dL (ref 0.40–1.20)
GFR: 90.53 mL/min (ref >60.00)
GLUCOSE: 75 mg/dL (ref 70–99)
POTASSIUM: 4.1 meq/L (ref 3.5–5.1)
SODIUM: 139 meq/L (ref 135–145)

## 2017-06-02 LAB — LIPID PANEL
CHOLESTEROL: 217 mg/dL — AB (ref 0–200)
HDL: 57 mg/dL (ref >39.00)
LDL Cholesterol: 125 mg/dL — ABNORMAL HIGH (ref 0–99)
NonHDL: 159.87
TRIGLYCERIDES: 175 mg/dL — AB (ref 0.0–149.0)
Total CHOL/HDL Ratio: 4
VLDL: 35 mg/dL (ref 0.0–40.0)

## 2017-06-02 LAB — TSH: TSH: 2.58 u[IU]/mL (ref 0.35–4.50)

## 2017-06-07 ENCOUNTER — Ambulatory Visit (INDEPENDENT_AMBULATORY_CARE_PROVIDER_SITE_OTHER): Payer: 59 | Admitting: Family Medicine

## 2017-06-07 ENCOUNTER — Encounter: Payer: Self-pay | Admitting: Family Medicine

## 2017-06-07 VITALS — BP 110/78 | HR 64 | Temp 98.2°F | Wt 145.0 lb

## 2017-06-07 DIAGNOSIS — C44622 Squamous cell carcinoma of skin of right upper limb, including shoulder: Secondary | ICD-10-CM | POA: Diagnosis not present

## 2017-06-07 DIAGNOSIS — L570 Actinic keratosis: Secondary | ICD-10-CM

## 2017-06-07 DIAGNOSIS — C44602 Unspecified malignant neoplasm of skin of right upper limb, including shoulder: Secondary | ICD-10-CM

## 2017-06-07 NOTE — Patient Instructions (Signed)
Remove the Band-Aids tomorrow  Within 2 weeks we will call you the report......... if we do not call you within 2 weeks call us

## 2017-06-07 NOTE — Progress Notes (Signed)
Julia Norman is a 66 year old female comes in today for move of the lesion on her right arm  We did a physical examination recently and noticed an abnormal lesion right arm. She's had a history of dysplastic nevi.  She was taken to treatment room and after informed consent lesion was measured at 10 mm right forearm  Skin was cleaned with alcohol 1% Xylocaine with epinephrine was used for anesthesia. Lesion was removed base was cauterized Band-Aid with date was applied. The lesion was sent for pathologic analysis.  Abnormal lesion right forearm appears to be an irritated AK........... path pending

## 2017-06-20 DIAGNOSIS — C44602 Unspecified malignant neoplasm of skin of right upper limb, including shoulder: Secondary | ICD-10-CM | POA: Insufficient documentation

## 2017-06-22 ENCOUNTER — Telehealth: Payer: Self-pay | Admitting: Family Medicine

## 2017-06-22 ENCOUNTER — Other Ambulatory Visit: Payer: Self-pay

## 2017-06-22 DIAGNOSIS — IMO0002 Reserved for concepts with insufficient information to code with codable children: Secondary | ICD-10-CM

## 2017-06-22 NOTE — Telephone Encounter (Signed)
Copied from [place] [id number]. Topic: Referral - Question >> Jun 22, 2017 10:31 AM Scherrie Gerlach wrote: Reason for CRM: Crystal with [facility] dermatology states they need to know what the pt is being referred for. They have scheduled her an excision, but when they called to confirm appt with the pt, pt states she thought they got everything at the biopsy, and unaware of anything else that is needed. So they need a call back confirming exactly what referral is for and need to let the pt know as well.

## 2017-06-22 NOTE — Telephone Encounter (Signed)
Spoke with Crystal from [facility], stated that she wanted to be sure that  pt needs consultation according to the pathology report. A confirmation was made and pt is scheduled for the procedure on 07/18/2017

## 2017-06-29 NOTE — Progress Notes (Signed)
Shave excision w #15 blade w 3 mm margins

## 2017-07-26 ENCOUNTER — Telehealth: Payer: Self-pay

## 2017-07-26 ENCOUNTER — Encounter: Payer: 59 | Admitting: Family Medicine

## 2017-07-26 NOTE — Telephone Encounter (Signed)
Copied from [place] [phone]. Topic: Referral - Status >> Jul 26, 2017  2:14 PM Scherrie Gerlach wrote: Reason for CRM: [place] dermatology called to advise pt has been scheduled twice and has cancelled twice. Dr Sarajane Jews wanted dr todd to know    Dr. Sherren Mocha - FYI. Referral notes have been updated to reflect this. Thanks!

## 2018-03-16 ENCOUNTER — Encounter: Payer: Self-pay | Admitting: Sports Medicine

## 2018-03-16 ENCOUNTER — Ambulatory Visit (INDEPENDENT_AMBULATORY_CARE_PROVIDER_SITE_OTHER): Payer: 59 | Admitting: Sports Medicine

## 2018-03-16 VITALS — BP 104/66 | HR 75 | Resp 19 | Ht 66.0 in | Wt 150.0 lb

## 2018-03-16 DIAGNOSIS — M79672 Pain in left foot: Secondary | ICD-10-CM

## 2018-03-16 DIAGNOSIS — B070 Plantar wart: Secondary | ICD-10-CM

## 2018-03-16 NOTE — Progress Notes (Signed)
Subjective: Julia Norman is a 67 y.o. female patient who presents to office for evaluation of Left foot pain secondary to moderately painful wart at the plantar lateral foot.  Patient reports that she has tried over-the-counter wart treatments with no relief in symptoms.  Reports that this has been going on for about 6 months states that the area gets tender or sore with pressure otherwise it is hard to characterize her pain states that [place] the [place] 2 [place] the area was drying up and getting better.  Patient denies any other pedal complaints.   Review of Systems  All other systems reviewed and are negative.    Patient Active Problem List   Diagnosis Date Noted  . Skin cancer of arm, right 06/20/2017  . Hearing loss 11/14/2015  . Low back pain radiating to left lower extremity 10/17/2014  . Routine general medical examination at a health care facility 06/03/2014  . NEOPLASM, MALIGNANT, SKIN, MULTIPLE, HX OF 01/15/2009    No current outpatient medications on file prior to visit.   No current facility-administered medications on file prior to visit.     Allergies  Allergen Reactions  . Codeine     Objective:  General: Alert and oriented x3 in no acute distress  Dermatology: Keratotic lesion present measuring 1 cm at the plantar lateral left foot with no skin lines transversing the lesion, pain is present with medial lateral pressure to the lesion, capillaries with pin point bleeding noted, no webspace macerations, no ecchymosis bilateral, all nails x 10 are well manicured.  Vascular: Dorsalis Pedis and Posterior Tibial pedal pulses 1/4, Capillary Fill Time 3 seconds, + pedal hair growth bilateral, no edema bilateral lower extremities, Temperature gradient within normal limits.  Neurology: Gross sensation intact via light touch bilateral.  Musculoskeletal: Mild tenderness with palpation at the lesion site on Left, Muscular strength 5/5 in all groups without pain or  limitation on range of motion.  Left greater than right bunion deformity noted is currently asymptomatic. Assessment and Plan: Problem List Items Addressed This Visit    None    Visit Diagnoses    Plantar wart    -  Primary   Left foot pain         -Complete examination performed -Discussed treatment options for wart on left lateral foot x1 -Parred keratoic warty lesion using a chisel blade; treated the area with Catharidin covered offloading pad and bandaid; Advised patient of blistering reaction that will occur from application of medication and once this happens replace bandaid with neosporin and tape/bandaid -Recommend ice elevation Tylenol Motrin as needed for pain -Advised good hygiene habits -Patient to return to office 3 weeks or sooner if condition worsens.  Advised patient if fails to improve may benefit from surgical excision of wart.  Landis Martins, DPM

## 2018-03-16 NOTE — Progress Notes (Signed)
   Subjective:    Patient ID: Julia Norman, female    DOB: Jan 26, 1951, 67 y.o.   MRN: [id number]  HPI    Review of Systems  All other systems reviewed and are negative.      Objective:   Physical Exam        Assessment & Plan:

## 2018-04-11 ENCOUNTER — Encounter: Payer: Self-pay | Admitting: Sports Medicine

## 2018-04-11 ENCOUNTER — Ambulatory Visit (INDEPENDENT_AMBULATORY_CARE_PROVIDER_SITE_OTHER): Payer: 59 | Admitting: Sports Medicine

## 2018-04-11 DIAGNOSIS — B070 Plantar wart: Secondary | ICD-10-CM

## 2018-04-11 DIAGNOSIS — M79672 Pain in left foot: Secondary | ICD-10-CM

## 2018-04-11 NOTE — Progress Notes (Signed)
Subjective: Julia Norman is a 67 y.o. female patient who returns to office for evaluation of Left foot pain secondary to moderately painful wart at the plantar lateral foot. Patient denies any blister reaction or any other problems at this time.    Patient Active Problem List   Diagnosis Date Noted  . Skin cancer of arm, right 06/20/2017  . Hearing loss 11/14/2015  . Low back pain radiating to left lower extremity 10/17/2014  . Routine general medical examination at a health care facility 06/03/2014  . NEOPLASM, MALIGNANT, SKIN, MULTIPLE, HX OF 01/15/2009    No current outpatient medications on file prior to visit.   No current facility-administered medications on file prior to visit.     Allergies  Allergen Reactions  . Codeine     Objective:  General: Alert and oriented x3 in no acute distress  Dermatology: Keratotic lesion present measuring 1 cm at the plantar lateral left foot with no skin lines transversing the lesion, pain is present with medial lateral pressure to the lesion, capillaries with pin point bleeding noted, no webspace macerations, no ecchymosis bilateral, all nails x 10 are well manicured.  Vascular: Dorsalis Pedis and Posterior Tibial pedal pulses 1/4, Capillary Fill Time 3 seconds, + pedal hair growth bilateral, no edema bilateral lower extremities, Temperature gradient within normal limits.  Neurology: Gross sensation intact via light touch bilateral.  Musculoskeletal: Mild tenderness with palpation at the lesion site on Left, Muscular strength 5/5 in all groups without pain or limitation on range of motion.  Left greater than right bunion deformity noted is currently asymptomatic. Assessment and Plan: Problem List Items Addressed This Visit    None    Visit Diagnoses    Plantar wart    -  Primary   Left foot pain         -Complete examination performed -Re-Discussed treatment options for wart on left lateral foot x1 -Parred keratoic warty lesion  using a chisel blade; treated the area with Catharidin treatment #2 covered offloading pad and bandaid; Advised patient of blistering reaction that will occur from application of medication and once this happens replace bandaid with neosporin and tape/bandaid -Recommend ice elevation Tylenol Motrin as needed for pain -Advised good hygiene habits -Patient to return to office 3-4 weeks or sooner if condition worsens.  Advised patient if fails to improve may benefit from laser with surgical excision of wart.  Landis Martins, DPM

## 2018-05-02 ENCOUNTER — Ambulatory Visit (INDEPENDENT_AMBULATORY_CARE_PROVIDER_SITE_OTHER): Payer: 59 | Admitting: Sports Medicine

## 2018-05-02 ENCOUNTER — Encounter: Payer: Self-pay | Admitting: Sports Medicine

## 2018-05-02 DIAGNOSIS — M79672 Pain in left foot: Secondary | ICD-10-CM

## 2018-05-02 DIAGNOSIS — B070 Plantar wart: Secondary | ICD-10-CM

## 2018-05-02 MED ORDER — FLUOROURACIL 5 % EX CREA: TOPICAL_CREAM | Freq: Two times a day (BID) | CUTANEOUS | 0 refills | Status: DC

## 2018-05-02 NOTE — Progress Notes (Signed)
Subjective: Julia Norman is a 67 y.o. female patient who returns to office for evaluation of Left foot pain secondary to moderately painful wart at the plantar lateral foot, treatment # 2 of canthrone was applied last visit. Patient reports that she had but states that since her last treatment feels like it helped her wart and reports that by removing the pad it seems like it helped to decrease any pressure over the area which helped to manage the pain however today denies any pain to the area.  Patient denies swelling, warmth, bruising, drainage or any other constitutional symptoms at this time.  Patient Active Problem List   Diagnosis Date Noted  . Skin cancer of arm, right 06/20/2017  . Hearing loss 11/14/2015  . Low back pain radiating to left lower extremity 10/17/2014  . Routine general medical examination at a health care facility 06/03/2014  . NEOPLASM, MALIGNANT, SKIN, MULTIPLE, HX OF 01/15/2009    No current outpatient medications on file prior to visit.   No current facility-administered medications on file prior to visit.     Allergies  Allergen Reactions  . Codeine     Objective:  General: Alert and oriented x3 in no acute distress  Dermatology: Keratotic lesion present measuring 1 cm at the plantar lateral left foot with no skin lines transversing the lesion once debrided wart appeared resolved, minimal pain is present with medial lateral pressure to the lesion, capillaries with pin point bleeding noted, no webspace macerations, no ecchymosis bilateral, all nails x 10 are well manicured.  Vascular: Dorsalis Pedis and Posterior Tibial pedal pulses 1/4, Capillary Fill Time 3 seconds, + pedal hair growth bilateral, no edema bilateral lower extremities, Temperature gradient within normal limits.  Neurology: Gross sensation intact via light touch bilateral.  Musculoskeletal: Decreased tenderness with palpation at the lesion site on Left, Muscular strength 5/5 in all groups  without pain or limitation on range of motion.  Left greater than right bunion deformity noted is currently asymptomatic. Assessment and Plan: Problem List Items Addressed This Visit    None    Visit Diagnoses    Plantar wart    -  Primary   Relevant Medications   fluorouracil (EFUDEX) 5 % cream   Left foot pain         -Complete examination performed -Re-Discussed treatment options for wart on left lateral foot x1 -Parred keratoic warty lesion using a chisel blade with wart appearing to be resolved however due to the violent nature and the deep-seated position of the wart I did apply Salinocaine an Band-Aid dressing and prescribed to patient Efudex cream to use twice a day for at least 2 weeks to make sure the wart is gone.  Advised patient if there is any loose callus skin to use a pumice stone to the area and to return to office if wart recurs or comes back -Patient to return to office as needed. Landis Martins, DPM

## 2018-05-11 ENCOUNTER — Ambulatory Visit (INDEPENDENT_AMBULATORY_CARE_PROVIDER_SITE_OTHER): Payer: 59 | Admitting: Family Medicine

## 2018-05-11 ENCOUNTER — Encounter: Payer: Self-pay | Admitting: Family Medicine

## 2018-05-11 VITALS — BP 118/80 | HR 68 | Temp 98.0°F | Wt 159.0 lb

## 2018-05-11 DIAGNOSIS — R079 Chest pain, unspecified: Secondary | ICD-10-CM | POA: Diagnosis not present

## 2018-05-11 DIAGNOSIS — M79602 Pain in left arm: Secondary | ICD-10-CM

## 2018-05-11 DIAGNOSIS — Z23 Encounter for immunization: Secondary | ICD-10-CM | POA: Diagnosis not present

## 2018-05-11 DIAGNOSIS — R0989 Other specified symptoms and signs involving the circulatory and respiratory systems: Secondary | ICD-10-CM

## 2018-05-11 LAB — CBC WITH DIFFERENTIAL/PLATELET
Basophils Absolute: 0 10*3/uL (ref 0.0–0.1)
Basophils Relative: 0.5 % (ref 0.0–3.0)
EOS PCT: 1.7 % (ref 0.0–5.0)
Eosinophils Absolute: 0.1 10*3/uL (ref 0.0–0.7)
HCT: 45 % (ref 36.0–46.0)
Hemoglobin: 15.5 g/dL — ABNORMAL HIGH (ref 12.0–15.0)
Lymphocytes Relative: 31.2 % (ref 12.0–46.0)
Lymphs Abs: 2.4 10*3/uL (ref 0.7–4.0)
MCHC: 34.4 g/dL (ref 30.0–36.0)
MCV: 92.5 fl (ref 78.0–100.0)
MONO ABS: 0.7 10*3/uL (ref 0.1–1.0)
MONOS PCT: 8.6 % (ref 3.0–12.0)
NEUTROS PCT: 58 % (ref 43.0–77.0)
Neutro Abs: 4.5 10*3/uL (ref 1.4–7.7)
Platelets: 263 10*3/uL (ref 150.0–400.0)
RBC: 4.87 Mil/uL (ref 3.87–5.11)
RDW: 13.6 % (ref 11.5–15.5)
WBC: 7.8 10*3/uL (ref 4.0–10.5)

## 2018-05-11 LAB — BASIC METABOLIC PANEL
BUN: 18 mg/dL (ref 6–23)
CALCIUM: 9.7 mg/dL (ref 8.4–10.5)
CO2: 28 mEq/L (ref 19–32)
CREATININE: 0.68 mg/dL (ref 0.40–1.20)
Chloride: 107 mEq/L (ref 96–112)
GFR: 91.8 mL/min (ref >60.00)
Glucose, Bld: 83 mg/dL (ref 70–99)
Potassium: 4.4 mEq/L (ref 3.5–5.1)
SODIUM: 141 meq/L (ref 135–145)

## 2018-05-11 LAB — LIPID PANEL
CHOL/HDL RATIO: 4
Cholesterol: 228 mg/dL — ABNORMAL HIGH (ref 0–200)
HDL: 58.2 mg/dL (ref >39.00)
LDL CALC: 145 mg/dL — AB (ref 0–99)
NONHDL: 169.65
Triglycerides: 125 mg/dL (ref 0.0–149.0)
VLDL: 25 mg/dL (ref 0.0–40.0)

## 2018-05-11 LAB — TROPONIN I: TNIDX: 0.01 ug/L (ref 0.00–0.06)

## 2018-05-11 NOTE — Progress Notes (Signed)
Subjective:    Patient ID: Julia Norman, female    DOB: [birth year], 67 y.o.   MRN: [id number]  No chief complaint on file.   HPI Patient was seen today for acute concern.  Pt endorses 3/10 pain down left arm.  Noted as an ache or a "headache in arm" x2 weeks.  Also with "twinge/cramp" in the low sternum/left chest.  Better at night.  Worse during the day and when driving.  Also notes decreased energy/ feeling more tired.  Endorses increased stress at work.  Tried ibuprofen for her symptoms.  She denies any increased lifting, pulling, pushing, diaphoresis.  Denies h/o HTN, HLD, DM.  Past Medical History:  Diagnosis Date  . Cancer ([place])    skin  . Kidney stones     Allergies  Allergen Reactions  . Codeine     ROS General: Denies fever, chills, night sweats, changes in weight, changes in appetite HEENT: Denies headaches, ear pain, changes in vision, rhinorrhea, sore throat CV: Denies palpitations, SOB, orthopnea  +CP Pulm: Denies SOB, cough, wheezing GI: Denies abdominal pain, nausea, vomiting, diarrhea, constipation GU: Denies dysuria, hematuria, frequency, vaginal discharge Msk: Denies muscle cramps, joint pains  +L arm pain Neuro: Denies weakness, numbness, tingling Skin: Denies rashes, bruising Psych: Denies depression, anxiety, hallucinations    Objective:    Blood pressure 118/80, pulse 68, temperature 98 F (36.7 C), temperature source Oral, weight 159 lb (72.1 kg), last menstrual period 06/23/2005, SpO2 98 %.  Gen. Pleasant, well-nourished, in no distress, normal affect  Neck: No JVD, no thyromegaly, mild carotid bruits on R Lungs: no accessory muscle use, CTAB, no wheezes or rales Cardiovascular: RRR, no m/r/g, no peripheral edema Musculoskeletal: No TTP of chest, b/l arms, spine or back.  No deformities, no cyanosis or clubbing, normal tone  Neuro:  A&Ox3, CN II-XII intact, normal gait Skin:  Warm, no lesions/ rash  Wt Readings from Last 3 Encounters:  05/11/18  159 lb (72.1 kg)  03/16/18 150 lb (68 kg)  06/07/17 145 lb (65.8 kg)    Lab Results  Component Value Date   WBC 8.7 06/01/2017   HGB 15.5 (H) 06/01/2017   HCT 45.9 06/01/2017   PLT 290.0 06/01/2017   GLUCOSE 75 06/01/2017   CHOL 217 (H) 06/01/2017   TRIG 175.0 (H) 06/01/2017   HDL 57.00 06/01/2017   LDLDIRECT 128.2 05/27/2014   LDLCALC 125 (H) 06/01/2017   ALT 15 06/01/2017   AST 16 06/01/2017   NA 139 06/01/2017   K 4.1 06/01/2017   CL 102 06/01/2017   CREATININE 0.69 06/01/2017   BUN 15 06/01/2017   CO2 30 06/01/2017   TSH 2.58 06/01/2017    Assessment/Plan:  Left arm pain  - Plan: EKG 12-Lead  Chest pain, unspecified type  -currently stable. -Possibly 2/2 increased stress, cardiac in nature, MSK-though unable to reproduce on exam. -EKG compared and similar to previous study from 06/03/14.  EKG with sinus bradycardia, VR 57, ST elevation in V1 and V3 seen on previous EKG, now V2 with ST elevation.  Cannot r/o ischemia.   Low voltage in limb leads -Troponin negative. - Plan: CBC with Differential/Platelet, Basic metabolic panel, Troponin I, Ambulatory referral to Cardiology, Lipid panel  Bruit of right carotid artery  - Plan: Ambulatory referral to Cardiology, Lipid panel  Need for immunization against influenza  - Plan: Flu vaccine HIGH DOSE PF   F/u prn  Grier Mitts, MD

## 2018-05-11 NOTE — Patient Instructions (Signed)
Nonspecific Chest Pain °Chest pain can be caused by many different conditions. There is always a chance that your pain could be related to something serious, such as a heart attack or a blood clot in your lungs. Chest pain can also be caused by conditions that are not life-threatening. If you have chest pain, it is very important to follow up with your health care provider. °What are the causes? °Causes of this condition include: °· Heartburn. °· Pneumonia or bronchitis. °· Anxiety or stress. °· Inflammation around your heart (pericarditis) or lung (pleuritis or pleurisy). °· A blood clot in your lung. °· A collapsed lung (pneumothorax). This can develop suddenly on its own (spontaneous pneumothorax) or from trauma to the chest. °· Shingles infection (varicella-zoster virus). °· Heart attack. °· Damage to the bones, muscles, and cartilage that make up your chest wall. This can include: °? Bruised bones due to injury. °? Strained muscles or cartilage due to frequent or repeated coughing or overwork. °? Fracture to one or more ribs. °? Sore cartilage due to inflammation (costochondritis). ° °What increases the risk? °Risk factors for this condition may include: °· Activities that increase your risk for trauma or injury to your chest. °· Respiratory infections or conditions that cause frequent coughing. °· Medical conditions or overeating that can cause heartburn. °· Heart disease or family history of heart disease. °· Conditions or health behaviors that increase your risk of developing a blood clot. °· Having had chicken pox (varicella zoster). ° °What are the signs or symptoms? °Chest pain can feel like: °· Burning or tingling on the surface of your chest or deep in your chest. °· Crushing, pressure, aching, or squeezing pain. °· Dull or sharp pain that is worse when you move, cough, or take a deep breath. °· Pain that is also felt in your back, neck, shoulder, or arm, or pain that spreads to any of these  areas. ° °Your chest pain may come and go, or it may stay constant. °How is this diagnosed? °Lab tests or other studies may be needed to find the cause of your pain. Your health care provider may have you take a test called an ECG (electrocardiogram). An ECG records your heartbeat patterns at the time the test is performed. You may also have other tests, such as: °· Transthoracic echocardiogram (TTE). In this test, sound waves are used to create a picture of the heart structures and to look at how blood flows through your heart. °· Transesophageal echocardiogram (TEE). This is a more advanced imaging test that takes images from inside your body. It allows your health care provider to see your heart in finer detail. °· Cardiac monitoring. This allows your health care provider to monitor your heart rate and rhythm in real time. °· Holter monitor. This is a portable device that records your heartbeat and can help to diagnose abnormal heartbeats. It allows your health care provider to track your heart activity for several days, if needed. °· Stress tests. These can be done through exercise or by taking medicine that makes your heart beat more quickly. °· Blood tests. °· Other imaging tests. ° °How is this treated? °Treatment depends on what is causing your chest pain. Treatment may include: °· Medicines. These may include: °? Acid blockers for heartburn. °? Anti-inflammatory medicine. °? Pain medicine for inflammatory conditions. °? Antibiotic medicine, if an infection is present. °? Medicines to dissolve blood clots. °? Medicines to treat coronary artery disease (CAD). °· Supportive care for conditions that   do not require medicines. This may include: °? Resting. °? Applying heat or cold packs to injured areas. °? Limiting activities until pain decreases. ° °Follow these instructions at home: °Medicines °· If you were prescribed an antibiotic, take it as told by your health care provider. Do not stop taking the  antibiotic even if you start to feel better. °· Take over-the-counter and prescription medicines only as told by your health care provider. °Lifestyle °· Do not use any products that contain nicotine or tobacco, such as cigarettes and e-cigarettes. If you need help quitting, ask your health care provider. °· Do not drink alcohol. °· Make lifestyle changes as directed by your health care provider. These may include: °? Getting regular exercise. Ask your health care provider to suggest some activities that are safe for you. °? Eating a heart-healthy diet. A registered dietitian can help you to learn healthy eating options. °? Maintaining a healthy weight. °? Managing diabetes, if necessary. °? Reducing stress, such as with yoga or relaxation techniques. °General instructions °· Avoid any activities that bring on chest pain. °· If heartburn is the cause for your chest pain, raise (elevate) the head of your bed about 6 inches (15 cm) by putting blocks under the legs. Sleeping with more pillows does not effectively relieve heartburn because it only changes the position of your head. °· Keep all follow-up visits as told by your health care provider. This is important. This includes any further testing if your chest pain does not go away. °Contact a health care provider if: °· Your chest pain does not go away. °· You have a rash with blisters on your chest. °· You have a fever. °· You have chills. °Get help right away if: °· Your chest pain is worse. °· You have a cough that gets worse, or you cough up blood. °· You have severe pain in your abdomen. °· You have severe weakness. °· You faint. °· You have sudden, unexplained chest discomfort. °· You have sudden, unexplained discomfort in your arms, back, neck, or jaw. °· You have shortness of breath at any time. °· You suddenly start to sweat, or your skin gets clammy. °· You feel nauseous or you vomit. °· You suddenly feel light-headed or dizzy. °· Your heart begins to beat  quickly, or it feels like it is skipping beats. °These symptoms may represent a serious problem that is an emergency. Do not wait to see if the symptoms will go away. Get medical help right away. Call your local emergency services (911 in the [place] Do not drive yourself to the hospital. °This information is not intended to replace advice given to you by your health care provider. Make sure you discuss any questions you have with your health care provider. °Document Released: 03/17/2005 Document Revised: 03/01/2016 Document Reviewed: 03/01/2016 °Elsevier Interactive Patient Education © 2017 [employer] ° °

## 2018-05-15 ENCOUNTER — Encounter: Payer: Self-pay | Admitting: Family Medicine

## 2018-05-30 ENCOUNTER — Encounter: Payer: Self-pay | Admitting: Cardiology

## 2018-05-30 ENCOUNTER — Ambulatory Visit (INDEPENDENT_AMBULATORY_CARE_PROVIDER_SITE_OTHER): Payer: 59 | Admitting: Cardiology

## 2018-05-30 DIAGNOSIS — R0789 Other chest pain: Secondary | ICD-10-CM

## 2018-05-30 DIAGNOSIS — E785 Hyperlipidemia, unspecified: Secondary | ICD-10-CM | POA: Insufficient documentation

## 2018-05-30 DIAGNOSIS — E782 Mixed hyperlipidemia: Secondary | ICD-10-CM | POA: Diagnosis not present

## 2018-05-30 MED ORDER — NITROGLYCERIN 0.4 MG SL SUBL: 0.4000 mg | SUBLINGUAL_TABLET | SUBLINGUAL | 11 refills | Status: DC | PRN

## 2018-05-30 MED ORDER — ASPIRIN EC 81 MG PO TBEC: 81.0000 mg | DELAYED_RELEASE_TABLET | Freq: Every day | ORAL | 1 refills | Status: DC

## 2018-05-30 NOTE — Progress Notes (Signed)
Cardiology Office Note:    Date:  05/30/2018   ID:  Julia Norman, DOB 1950/10/06, MRN [id number]  PCP:  Dorena Cookey, MD  Cardiologist:  Jenean Lindau, MD   Referring MD: Billie Ruddy, MD    ASSESSMENT:    1. Chest discomfort   2. Mixed hyperlipidemia    PLAN:    In order of problems listed above:  1. Primary prevention stressed to the patient.  Importance of compliance with diet and medication stressed and she vocalized understanding.  Her blood pressure is stable.  Diet was discussed for dyslipidemia extensively and weight reduction was stressed. 2. Sublingual nitroglycerin prescription was sent, its protocol and 911 protocol explained and the patient vocalized understanding questions were answered to the patient's satisfaction 3. Patient symptoms are very concerning and I have asked her to take enteric-coated aspirin 81 mg on a daily basis.  To review and assess her symptoms she will have an exercise stress Cardiolite.  She knows to go to the nearest emergency room for any significant symptoms.  Follow-up appointment in a month or earlier if she has any concerns.   Medication Adjustments/Labs and Tests Ordered: Current medicines are reviewed at length with the patient today.  Concerns regarding medicines are outlined above.  No orders of the defined types were placed in this encounter.  No orders of the defined types were placed in this encounter.    History of Present Illness:    Julia Norman is a 67 y.o. female who is being seen today for the evaluation of chest discomfort and left arm pain.  At the request of Billie Ruddy, MD.  Patient is a pleasant 67 year old female.  She has no significant past medical history.  She tells me that she has been lax with her diet and exercise over the past several months and has been stressed about her job.  She mentions of left arm..  Some chest discomfort may not consistently come with the left arm pain.  She mentions to  me that the left arm pain comes with stress.  No orthopnea or PND.  She has not used nitroglycerin in the past.  At the time of my evaluation, the patient is alert awake oriented and in no distress.  She leads a sedentary lifestyle.  I wanted to ask her about sexual activity and the symptoms and she tells me that she is sexually inactive.  Past Medical History:  Diagnosis Date  . Cancer ([place])    skin  . Kidney stones     Past Surgical History:  Procedure Laterality Date  . CHOLECYSTECTOMY      Current Medications: No outpatient medications have been marked as taking for the 05/30/18 encounter (Office Visit) with Zollie Ellery, Reita Cliche, MD.     Allergies:   Codeine   Social History   Socioeconomic History  . Marital status: Married    Spouse name: Not on file  . Number of children: Not on file  . Years of education: Not on file  . Highest education level: Not on file  Occupational History  . Not on file  Social Needs  . Financial resource strain: Not on file  . Food insecurity:    Worry: Not on file    Inability: Not on file  . Transportation needs:    Medical: Not on file    Non-medical: Not on file  Tobacco Use  . Smoking status: Current Some Day Smoker  . Smokeless tobacco: Never  Used  Substance and Sexual Activity  . Alcohol use: Yes  . Drug use: No  . Sexual activity: Not on file  Lifestyle  . Physical activity:    Days per week: Not on file    Minutes per session: Not on file  . Stress: Not on file  Relationships  . Social connections:    Talks on phone: Not on file    Gets together: Not on file    Attends religious service: Not on file    Active member of club or organization: Not on file    Attends meetings of clubs or organizations: Not on file    Relationship status: Not on file  Other Topics Concern  . Not on file  Social History Narrative  . Not on file     Family History: The patient's family history includes Cancer in her mother; Depression in  her mother; Gallbladder disease in her mother; Heart disease in her father.  ROS:   Please see the history of present illness.    All other systems reviewed and are negative.  EKGs/Labs/Other Studies Reviewed:    The following studies were reviewed today: EKG reveals sinus rhythm and nonspecific ST-T changes.   Recent Labs: 06/01/2017: ALT 15; TSH 2.58 05/11/2018: BUN 18; Creatinine, Ser 0.68; Hemoglobin 15.5; Platelets 263.0; Potassium 4.4; Sodium 141  Recent Lipid Panel    Component Value Date/Time   CHOL 228 (H) 05/11/2018 1200   TRIG 125.0 05/11/2018 1200   HDL 58.20 05/11/2018 1200   CHOLHDL 4 05/11/2018 1200   VLDL 25.0 05/11/2018 1200   LDLCALC 145 (H) 05/11/2018 1200   LDLDIRECT 128.2 05/27/2014 0912    Physical Exam:    VS:  BP 120/80 (BP Location: Left Arm, Patient Position: Sitting, Cuff Size: Normal)   Pulse 68   Ht 5\' 6"  (1.676 m)   Wt 161 lb (73 kg)   LMP 06/23/2005 (Approximate) Comment: 10 years ago   SpO2 98%   BMI 25.99 kg/m     Wt Readings from Last 3 Encounters:  05/30/18 161 lb (73 kg)  05/11/18 159 lb (72.1 kg)  03/16/18 150 lb (68 kg)     GEN: Patient is in no acute distress HEENT: Normal NECK: No JVD; No carotid bruits LYMPHATICS: No lymphadenopathy CARDIAC: S1 S2 regular, 2/6 systolic murmur at the apex. RESPIRATORY:  Clear to auscultation without rales, wheezing or rhonchi  ABDOMEN: Soft, non-tender, non-distended MUSCULOSKELETAL:  No edema; No deformity  SKIN: Warm and dry NEUROLOGIC:  Alert and oriented x 3 PSYCHIATRIC:  Normal affect    Signed, Jenean Lindau, MD  05/30/2018 8:40 AM    [facility]

## 2018-05-30 NOTE — Patient Instructions (Signed)
Medication Instructions:  Your physician has recommended you make the following change in your medication:   Take as needed for chest pain: Nitroglycerin 0.4 mg sublingual (under your tongue) as needed for chest pain. If experiencing chest pain, stop what you are doing and sit down. Take 1 nitroglycerin and wait 5 minutes. If chest pain continues, take another nitroglycerin and wait 5 minutes. If chest pain does not subside, take 1 more nitroglycerin and dial 911. You make take a total of 3 nitroglycerin in a 15 minute time frame.  Start: Aspirin 81 mg daily   If you need a refill on your cardiac medications before your next appointment, please call your pharmacy.   Lab work: None.  If you have labs (blood work) drawn today and your tests are completely normal, you will receive your results only by: Marland Kitchen MyChart Message (if you have MyChart) OR . A paper copy in the mail If you have any lab test that is abnormal or we need to change your treatment, we will call you to review the results.  Testing/Procedures: Your physician has requested that you have en exercise stress myoview. For further information please visit [email or web address]. Please follow instruction sheet, as given.    Follow-Up: At [facility], you and your health needs are our priority.  As part of our continuing mission to provide you with exceptional heart care, we have created designated Provider Care Teams.  These Care Teams include your primary Cardiologist (physician) and Advanced Practice Providers (APPs -  Physician Assistants and Nurse Practitioners) who all work together to provide you with the care you need, when you need it. You will need a follow up appointment in 1 months.  Please call our office 2 months in advance to schedule this appointment.  You may see No primary care provider on file. or another member of our [employer] Provider Team in [place]: Shirlee More, MD . Jyl Heinz, MD  Any Other  Special Instructions Will Be Listed Below (If Applicable).  Nitroglycerin sublingual tablets What is this medicine? NITROGLYCERIN (nye troe GLI ser in) is a type of vasodilator. It relaxes blood vessels, increasing the blood and oxygen supply to your heart. This medicine is used to relieve chest pain caused by angina. It is also used to prevent chest pain before activities like climbing stairs, going outdoors in cold weather, or sexual activity. This medicine may be used for other purposes; ask your health care provider or pharmacist if you have questions. COMMON BRAND NAME(S): Nitroquick, Nitrostat, Nitrotab What should I tell my health care provider before I take this medicine? They need to know if you have any of these conditions: -anemia -head injury, recent stroke, or bleeding in the brain -liver disease -previous heart attack -an unusual or allergic reaction to nitroglycerin, other medicines, foods, dyes, or preservatives -pregnant or trying to get pregnant -breast-feeding How should I use this medicine? Take this medicine by mouth as needed. At the first sign of an angina attack (chest pain or tightness) place one tablet under your tongue. You can also take this medicine 5 to 10 minutes before an event likely to produce chest pain. Follow the directions on the prescription label. Let the tablet dissolve under the tongue. Do not swallow whole. Replace the dose if you accidentally swallow it. It will help if your mouth is not dry. Saliva around the tablet will help it to dissolve more quickly. Do not eat or drink, smoke or chew tobacco while a tablet is  dissolving. If you are not better within 5 minutes after taking ONE dose of nitroglycerin, call 9-1-1 immediately to seek emergency medical care. Do not take more than 3 nitroglycerin tablets over 15 minutes. If you take this medicine often to relieve symptoms of angina, your doctor or health care professional may provide you with different  instructions to manage your symptoms. If symptoms do not go away after following these instructions, it is important to call 9-1-1 immediately. Do not take more than 3 nitroglycerin tablets over 15 minutes. Talk to your pediatrician regarding the use of this medicine in children. Special care may be needed. Overdosage: If you think you have taken too much of this medicine contact a poison control center or emergency room at once. NOTE: This medicine is only for you. Do not share this medicine with others. What if I miss a dose? This does not apply. This medicine is only used as needed. What may interact with this medicine? Do not take this medicine with any of the following medications: -certain migraine medicines like ergotamine and dihydroergotamine (DHE) -medicines used to treat erectile dysfunction like sildenafil, tadalafil, and vardenafil -riociguat This medicine may also interact with the following medications: -alteplase -aspirin -heparin -medicines for high blood pressure -medicines for mental depression -other medicines used to treat angina -phenothiazines like chlorpromazine, mesoridazine, prochlorperazine, thioridazine This list may not describe all possible interactions. Give your health care provider a list of all the medicines, herbs, non-prescription drugs, or dietary supplements you use. Also tell them if you smoke, drink alcohol, or use illegal drugs. Some items may interact with your medicine. What should I watch for while using this medicine? Tell your doctor or health care professional if you feel your medicine is no longer working. Keep this medicine with you at all times. Sit or lie down when you take your medicine to prevent falling if you feel dizzy or faint after using it. Try to remain calm. This will help you to feel better faster. If you feel dizzy, take several deep breaths and lie down with your feet propped up, or bend forward with your head resting between your  knees. You may get drowsy or dizzy. Do not drive, use machinery, or do anything that needs mental alertness until you know how this drug affects you. Do not stand or sit up quickly, especially if you are an older patient. This reduces the risk of dizzy or fainting spells. Alcohol can make you more drowsy and dizzy. Avoid alcoholic drinks. Do not treat yourself for coughs, colds, or pain while you are taking this medicine without asking your doctor or health care professional for advice. Some ingredients may increase your blood pressure. What side effects may I notice from receiving this medicine? Side effects that you should report to your doctor or health care professional as soon as possible: -blurred vision -dry mouth -skin rash -sweating -the feeling of extreme pressure in the head -unusually weak or tired Side effects that usually do not require medical attention (report to your doctor or health care professional if they continue or are bothersome): -flushing of the face or neck -headache -irregular heartbeat, palpitations -nausea, vomiting This list may not describe all possible side effects. Call your doctor for medical advice about side effects. You may report side effects to FDA at 1-800-FDA-1088. Where should I keep my medicine? Keep out of the reach of children. Store at room temperature between 20 and 25 degrees C (68 and 77 degrees F). Store in original  container. Protect from light and moisture. Keep tightly closed. Throw away any unused medicine after the expiration date. NOTE: This sheet is a summary. It may not cover all possible information. If you have questions about this medicine, talk to your doctor, pharmacist, or health care provider.  2018 Elsevier/Gold Standard (2013-04-05 17:57:36)    Aspirin and Your Heart Aspirin is a medicine that affects the way blood clots. Aspirin can be used to help reduce the risk of blood clots, heart attacks, and other heart-related  problems. Should I take aspirin? Your health care provider will help you determine whether it is safe and beneficial for you to take aspirin daily. Taking aspirin daily may be beneficial if you:  Have had a heart attack or chest pain.  Have undergone open heart surgery such as coronary artery bypass surgery (CABG).  Have had coronary angioplasty.  Have experienced a stroke or transient ischemic attack (TIA).  Have peripheral vascular disease (PVD).  Have chronic heart rhythm problems such as atrial fibrillation.  Are there any risks of taking aspirin daily? Daily use of aspirin can increase your risk of side effects. Some of these include:  Bleeding. Bleeding problems can be minor or serious. An example of a minor problem is a cut that does not stop bleeding. An example of a more serious problem is stomach bleeding or bleeding into the brain. Your risk of bleeding is increased if you are also taking non-steroidal anti-inflammatory medicine (NSAIDs).  Increased bruising.  Upset stomach.  An allergic reaction. People who have nasal polyps have an increased risk of developing an aspirin allergy.  What are some guidelines I should follow when taking aspirin?  Take aspirin only as directed by your health care provider. Make sure you understand how much you should take and what form you should take. The two forms of aspirin are: ? Non-enteric-coated. This type of aspirin does not have a coating and is absorbed quickly. Non-enteric-coated aspirin is usually recommended for people with chest pain. This type of aspirin also comes in a chewable form. ? Enteric-coated. This type of aspirin has a special coating that releases the medicine very slowly. Enteric-coated aspirin causes less stomach upset than non-enteric-coated aspirin. This type of aspirin should not be chewed or crushed.  Drink alcohol in moderation. Drinking alcohol increases your risk of bleeding. When should I seek medical  care?  You have unusual bleeding or bruising.  You have stomach pain.  You have an allergic reaction. Symptoms of an allergic reaction include: ? Hives. ? Itchy skin. ? Swelling of the lips, tongue, or face.  You have ringing in your ears. When should I seek immediate medical care?  Your bowel movements are bloody, dark red, or black in color.  You vomit or cough up blood.  You have blood in your urine.  You cough, wheeze, or feel short of breath. If you have any of the following symptoms, this is an emergency. Do not wait to see if the pain will go away. Get medical help at once. Call your local emergency services (911 in the [place] Do not drive yourself to the hospital.  You have severe chest pain, especially if the pain is crushing or pressure-like and spreads to the arms, back, neck, or jaw.  You have stroke-like symptoms, such as: ? Loss of vision. ? Difficulty talking. ? Numbness or weakness on one side of your body. ? Numbness or weakness in your arm or leg. ? Not thinking clearly or feeling confused.  This information is not intended to replace advice given to you by your health care provider. Make sure you discuss any questions you have with your health care provider. Document Released: 05/20/2008 Document Revised: 10/15/2015 Document Reviewed: 09/12/2013 Elsevier Interactive Patient Education  2018 [employer].   Cardiac Nuclear Scan A cardiac nuclear scan is a test that measures blood flow to the heart when a person is resting and when he or she is exercising. The test looks for problems such as:  Not enough blood reaching a portion of the heart.  The heart muscle not working normally.  You may need this test if:  You have heart disease.  You have had abnormal lab results.  You have had heart surgery or angioplasty.  You have chest pain.  You have shortness of breath.  In this test, a radioactive dye (tracer) is injected into your bloodstream.  After the tracer has traveled to your heart, an imaging device is used to measure how much of the tracer is absorbed by or distributed to various areas of your heart. This procedure is usually done at a hospital and takes 2-4 hours. Tell a health care provider about:  Any allergies you have.  All medicines you are taking, including vitamins, herbs, eye drops, creams, and over-the-counter medicines.  Any problems you or family members have had with the use of anesthetic medicines.  Any blood disorders you have.  Any surgeries you have had.  Any medical conditions you have.  Whether you are pregnant or may be pregnant. What are the risks? Generally, this is a safe procedure. However, problems may occur, including:  Serious chest pain and heart attack. This is only a risk if the stress portion of the test is done.  Rapid heartbeat.  Sensation of warmth in your chest. This usually passes quickly.  What happens before the procedure?  Ask your health care provider about changing or stopping your regular medicines. This is especially important if you are taking diabetes medicines or blood thinners.  Remove your jewelry on the day of the procedure. What happens during the procedure?  An IV tube will be inserted into one of your veins.  Your health care provider will inject a small amount of radioactive tracer through the tube.  You will wait for 20-40 minutes while the tracer travels through your bloodstream.  Your heart activity will be monitored with an electrocardiogram (ECG).  You will lie down on an exam table.  Images of your heart will be taken for about 15-20 minutes.  You may be asked to exercise on a treadmill or stationary bike. While you exercise, your heart's activity will be monitored with an ECG, and your blood pressure will be checked. If you are unable to exercise, you may be given a medicine to increase blood flow to parts of your heart.  When blood flow to  your heart has peaked, a tracer will again be injected through the IV tube.  After 20-40 minutes, you will get back on the exam table and have more images taken of your heart.  When the procedure is over, your IV tube will be removed. The procedure may vary among health care providers and hospitals. Depending on the type of tracer used, scans may need to be repeated 3-4 hours later. What happens after the procedure?  Unless your health care provider tells you otherwise, you may return to your normal schedule, including diet, activities, and medicines.  Unless your health care provider tells you  otherwise, you may increase your fluid intake. This will help flush the contrast dye from your body. Drink enough fluid to keep your urine clear or pale yellow.  It is up to you to get your test results. Ask your health care provider, or the department that is doing the test, when your results will be ready. Summary  A cardiac nuclear scan measures the blood flow to the heart when a person is resting and when he or she is exercising.  You may need this test if you are at risk for heart disease.  Tell your health care provider if you are pregnant.  Unless your health care provider tells you otherwise, increase your fluid intake. This will help flush the contrast dye from your body. Drink enough fluid to keep your urine clear or pale yellow. This information is not intended to replace advice given to you by your health care provider. Make sure you discuss any questions you have with your health care provider. Document Released: 07/02/2004 Document Revised: 06/09/2016 Document Reviewed: 05/16/2013 Elsevier Interactive Patient Education  2017 [employer].

## 2018-06-05 ENCOUNTER — Telehealth (HOSPITAL_COMMUNITY): Payer: Self-pay | Admitting: *Deleted

## 2018-06-05 ENCOUNTER — Encounter: Payer: 59 | Admitting: Family Medicine

## 2018-06-05 NOTE — Telephone Encounter (Signed)
Patient given detailed instructions per Myocardial Perfusion Study Information Sheet for the test on 06/09/18 at 0945. Patient notified to arrive 15 minutes early and that it is imperative to arrive on time for appointment to keep from having the test rescheduled.  If you need to cancel or reschedule your appointment, please call the office within 24 hours of your appointment. . Patient verbalized understanding.Julia Norman, Ranae Palms

## 2018-06-05 NOTE — Telephone Encounter (Signed)
Patient given detailed instructions per Myocardial Perfusion Study Information Sheet for the test on 06/09/18 at 0945. Patient notified to arrive 15 minutes early and that it is imperative to arrive on time for appointment to keep from having the test rescheduled.  If you need to cancel or reschedule your appointment, please call the office within 24 hours of your appointment. . Patient verbalized understanding.Suzana Sohail, Ranae Palms

## 2018-06-09 ENCOUNTER — Ambulatory Visit (HOSPITAL_COMMUNITY): Payer: 59 | Attending: Internal Medicine

## 2018-06-09 ENCOUNTER — Telehealth: Payer: Self-pay

## 2018-06-09 DIAGNOSIS — E782 Mixed hyperlipidemia: Secondary | ICD-10-CM | POA: Diagnosis present

## 2018-06-09 DIAGNOSIS — R0789 Other chest pain: Secondary | ICD-10-CM | POA: Diagnosis present

## 2018-06-09 MED ORDER — TECHNETIUM TC 99M TETROFOSMIN IV KIT
8.2000 | PACK | Freq: Once | INTRAVENOUS | Status: AC | PRN
  Administered 2018-06-09: 8.2 via INTRAVENOUS
  Filled 2018-06-09: qty 9

## 2018-06-09 MED ORDER — TECHNETIUM TC 99M TETROFOSMIN IV KIT
25.8000 | PACK | Freq: Once | INTRAVENOUS | Status: AC | PRN
  Administered 2018-06-09: 25.8 via INTRAVENOUS
  Filled 2018-06-09: qty 26

## 2018-06-10 LAB — MYOCARDIAL PERFUSION IMAGING
CHL CUP RESTING HR STRESS: 69 {beats}/min
CSEPEW: 10.1 METS
Exercise duration (min): 8 min
Exercise duration (sec): 1 s
LV sys vol: 19 mL
LVDIAVOL: 47 mL (ref 46–106)
MPHR: 154 {beats}/min
Peak HR: 144 {beats}/min
Percent HR: 93 %
RATE: 2
SRS: 2
SSS: 0
TID: 0.9

## 2018-06-12 ENCOUNTER — Telehealth: Payer: Self-pay | Admitting: Family Medicine

## 2018-06-12 ENCOUNTER — Other Ambulatory Visit: Payer: Self-pay | Admitting: Family Medicine

## 2018-06-12 ENCOUNTER — Telehealth: Payer: Self-pay

## 2018-06-12 DIAGNOSIS — Z1211 Encounter for screening for malignant neoplasm of colon: Secondary | ICD-10-CM

## 2018-06-12 NOTE — Telephone Encounter (Signed)
-----   Message from Jenean Lindau, MD sent at 06/12/2018  8:12 AM EST ----- The results of the study is unremarkable. Please inform patient. I will discuss in detail at next appointment. Cc  primary care/referring physician Jenean Lindau, MD 06/12/2018 8:11 AM

## 2018-06-12 NOTE — Telephone Encounter (Signed)
Copied from [place] [id number]. Topic: Quick Communication - See Telephone Encounter >> Jun 12, 2018  1:24 PM Blase Mess A wrote: CRM for notification. See Telephone encounter for: 06/12/18.  Patient received a  call from [place]. Requested referral close to [place] or in [facility] . Can referral be placed again close to  [place]?  Please advise [phone]

## 2018-06-12 NOTE — Telephone Encounter (Signed)
Left message on machine for patient to return our call 

## 2018-06-12 NOTE — Telephone Encounter (Signed)
Called patient and left detailed voice message on patients phone regarding test results. 

## 2018-07-05 ENCOUNTER — Ambulatory Visit: Payer: BLUE CROSS/BLUE SHIELD | Admitting: Cardiology

## 2018-07-05 ENCOUNTER — Encounter: Payer: Self-pay | Admitting: Cardiology

## 2018-07-05 VITALS — BP 116/62 | HR 82 | Ht 66.0 in | Wt 160.0 lb

## 2018-07-05 DIAGNOSIS — R0989 Other specified symptoms and signs involving the circulatory and respiratory systems: Secondary | ICD-10-CM

## 2018-07-05 DIAGNOSIS — I472 Ventricular tachycardia: Secondary | ICD-10-CM | POA: Diagnosis not present

## 2018-07-05 DIAGNOSIS — F17210 Nicotine dependence, cigarettes, uncomplicated: Secondary | ICD-10-CM | POA: Diagnosis not present

## 2018-07-05 DIAGNOSIS — I4729 Other ventricular tachycardia: Secondary | ICD-10-CM

## 2018-07-05 DIAGNOSIS — E782 Mixed hyperlipidemia: Secondary | ICD-10-CM

## 2018-07-05 NOTE — Patient Instructions (Signed)
Medication Instructions:   Your physician recommends that you continue on your current medications as directed. Please refer to the Current Medication list given to you today.   If you need a refill on your cardiac medications before your next appointment, please call your pharmacy.   Lab work:  NONE   If you have labs (blood work) drawn today and your tests are completely normal, you will receive your results only by: Marland Kitchen MyChart Message (if you have MyChart) OR . A paper copy in the mail If you have any lab test that is abnormal or we need to change your treatment, we will call you to review the results.  Testing/Procedures:  Your physician has requested that you have a carotid duplex. This test is an ultrasound of the carotid arteries in your neck. It looks at blood flow through these arteries that supply the brain with blood. Allow one hour for this exam. There are no restrictions or special instructions.  Your physician has recommended that you wear an event monitor. Event monitors are medical devices that record the heart's electrical activity. Doctors most often [place] these monitors to diagnose arrhythmias. Arrhythmias are problems with the speed or rhythm of the heartbeat. The monitor is a small, portable device. You can wear one while you do your normal daily activities. This is usually used to diagnose what is causing palpitations/syncope (passing out).    Follow-Up: At [facility], you and your health needs are our priority.  As part of our continuing mission to provide you with exceptional heart care, we have created designated Provider Care Teams.  These Care Teams include your primary Cardiologist (physician) and Advanced Practice Providers (APPs -  Physician Assistants and Nurse Practitioners) who all work together to provide you with the care you need, when you need it.  You will need a follow up appointment in 6 months.  Please call our office 2 months in advance to schedule  this appointment.

## 2018-07-05 NOTE — Addendum Note (Signed)
Addended by: Orland Penman on: 07/05/2018 09:55 AM   Modules accepted: Orders

## 2018-07-05 NOTE — Progress Notes (Signed)
Cardiology Office Note:    Date:  07/05/2018   ID:  Julia Norman, DOB March 20, 1951, MRN [id number]  PCP:  Julia Cookey, MD  Cardiologist:  Jenean Lindau, MD   Referring MD: Julia Cookey, MD    ASSESSMENT:    1. Mixed hyperlipidemia   2. Nonsustained ventricular tachycardia ([place])   3. Cigarette smoker   4. Bilateral carotid bruits    PLAN:    In order of problems listed above:  1. Primary prevention stressed with the patient.  Importance of compliance with diet and medication stressed and she vocalized understanding.  Her blood pressure is stable. 2. Importance of regular exercise stressed with the patient and she plans to walk at least half an hour every day at least 5 times a week. 3. I spent 5 minutes with the patient discussing solely about smoking. Smoking cessation was counseled. I suggested to the patient also different medications and pharmacological interventions. Patient is keen to try stopping on its own at this time. He will get back to me if he needs any further assistance in this matter. 4. In view of the above, and her nonsustained ventricular tachycardia I will do a ZIO monitoring for 2 weeks.  Patient is not keen on beta-blocker therapy at this time. 5. She will have bilateral carotid evaluation for bruit 6. Patient will be seen in follow-up appointment in 6 months or earlier if the patient has any concerns    Medication Adjustments/Labs and Tests Ordered: Current medicines are reviewed at length with the patient today.  Concerns regarding medicines are outlined above.  No orders of the defined types were placed in this encounter.  No orders of the defined types were placed in this encounter.    No chief complaint on file.    History of Present Illness:    Julia Norman is a 68 y.o. female.  Patient was evaluated for chest discomfort.  During the stress test she had brief nonsustained ventricular tachycardia.  Stress test was otherwise  unremarkable with no evidence of ischemia and preserved ejection fraction.  Her recent chemistries were unremarkable.  She is here for follow-up.  She now mentions to me that she smokes a pack a week.  She started walking some on a regular basis.  No dizziness palpitations or syncope.  Past Medical History:  Diagnosis Date  . Cancer ([place])    skin  . Kidney stones     Past Surgical History:  Procedure Laterality Date  . CHOLECYSTECTOMY      Current Medications: Current Meds  Medication Sig  . aspirin EC 81 MG tablet Take 1 tablet (81 mg total) by mouth daily.  . nitroGLYCERIN (NITROSTAT) 0.4 MG SL tablet Place 1 tablet (0.4 mg total) under the tongue every 5 (five) minutes as needed for chest pain.     Allergies:   Codeine   Social History   Socioeconomic History  . Marital status: Married    Spouse name: Not on file  . Number of children: Not on file  . Years of education: Not on file  . Highest education level: Not on file  Occupational History  . Not on file  Social Needs  . Financial resource strain: Not on file  . Food insecurity:    Worry: Not on file    Inability: Not on file  . Transportation needs:    Medical: Not on file    Non-medical: Not on file  Tobacco Use  . Smoking  status: Current Some Day Smoker  . Smokeless tobacco: Never Used  Substance and Sexual Activity  . Alcohol use: Yes  . Drug use: No  . Sexual activity: Not on file  Lifestyle  . Physical activity:    Days per week: Not on file    Minutes per session: Not on file  . Stress: Not on file  Relationships  . Social connections:    Talks on phone: Not on file    Gets together: Not on file    Attends religious service: Not on file    Active member of club or organization: Not on file    Attends meetings of clubs or organizations: Not on file    Relationship status: Not on file  Other Topics Concern  . Not on file  Social History Narrative  . Not on file     Family History: The  patient's family history includes Cancer in her mother; Depression in her mother; Gallbladder disease in her mother; Heart disease in her father.  ROS:   Please see the history of present illness.    All other systems reviewed and are negative.  EKGs/Labs/Other Studies Reviewed:    The following studies were reviewed today: I discussed findings of the stress test with the patient at length.   Recent Labs: 05/11/2018: BUN 18; Creatinine, Ser 0.68; Hemoglobin 15.5; Platelets 263.0; Potassium 4.4; Sodium 141  Recent Lipid Panel    Component Value Date/Time   CHOL 228 (H) 05/11/2018 1200   TRIG 125.0 05/11/2018 1200   HDL 58.20 05/11/2018 1200   CHOLHDL 4 05/11/2018 1200   VLDL 25.0 05/11/2018 1200   LDLCALC 145 (H) 05/11/2018 1200   LDLDIRECT 128.2 05/27/2014 0912    Physical Exam:    VS:  BP 116/62 (BP Location: Right Arm, Patient Position: Sitting, Cuff Size: Normal)   Pulse 82   Ht 5\' 6"  (1.676 m)   Wt 160 lb (72.6 kg)   LMP 06/23/2005 (Approximate) Comment: 10 years ago   SpO2 98%   BMI 25.82 kg/m     Wt Readings from Last 3 Encounters:  07/05/18 160 lb (72.6 kg)  05/30/18 161 lb (73 kg)  05/11/18 159 lb (72.1 kg)     GEN: Patient is in no acute distress HEENT: Normal NECK: No JVD; bilateral soft carotid bruits LYMPHATICS: No lymphadenopathy CARDIAC: Hear sounds regular, 2/6 systolic murmur at the apex. RESPIRATORY:  Clear to auscultation without rales, wheezing or rhonchi  ABDOMEN: Soft, non-tender, non-distended MUSCULOSKELETAL:  No edema; No deformity  SKIN: Warm and dry NEUROLOGIC:  Alert and oriented x 3 PSYCHIATRIC:  Normal affect   Signed, Jenean Lindau, MD  07/05/2018 9:34 AM    [place]

## 2018-07-07 NOTE — Telephone Encounter (Signed)
Called pt left a message to call the office regarding her request for referral to GI in [place]

## 2018-07-12 ENCOUNTER — Ambulatory Visit (INDEPENDENT_AMBULATORY_CARE_PROVIDER_SITE_OTHER): Payer: BLUE CROSS/BLUE SHIELD

## 2018-07-12 DIAGNOSIS — R0989 Other specified symptoms and signs involving the circulatory and respiratory systems: Secondary | ICD-10-CM

## 2018-07-12 DIAGNOSIS — I472 Ventricular tachycardia: Secondary | ICD-10-CM

## 2018-08-02 ENCOUNTER — Telehealth: Payer: Self-pay | Admitting: *Deleted

## 2018-08-02 NOTE — Telephone Encounter (Signed)
Pt called [place] back about monitor results. Informed pt that results are unremarkable and there is no change in treatment plan, Dr. Geraldo Pitter will discuss in detail at next visit. Pt voiced understanding.

## 2018-08-07 ENCOUNTER — Other Ambulatory Visit (INDEPENDENT_AMBULATORY_CARE_PROVIDER_SITE_OTHER): Payer: BLUE CROSS/BLUE SHIELD

## 2018-08-07 DIAGNOSIS — E785 Hyperlipidemia, unspecified: Secondary | ICD-10-CM

## 2018-08-07 LAB — LIPID PANEL
Cholesterol: 212 mg/dL — ABNORMAL HIGH (ref 0–200)
HDL: 52.8 mg/dL (ref >39.00)
LDL Cholesterol: 137 mg/dL — ABNORMAL HIGH (ref 0–99)
NonHDL: 159.15
Total CHOL/HDL Ratio: 4
Triglycerides: 109 mg/dL (ref 0.0–149.0)
VLDL: 21.8 mg/dL (ref 0.0–40.0)

## 2018-08-10 ENCOUNTER — Telehealth: Payer: Self-pay | Admitting: *Deleted

## 2018-08-10 NOTE — Telephone Encounter (Signed)
[employer] sent fax requesting notes and tests results relating to pt's monitor. Sent office notes from 07/05/18 along with EKG report and nuclear stress test results to Leane Para at [facility]

## 2019-02-05 DIAGNOSIS — N3020 Other chronic cystitis without hematuria: Secondary | ICD-10-CM | POA: Diagnosis not present

## 2019-02-05 DIAGNOSIS — R100 Acute abdomen: Secondary | ICD-10-CM | POA: Diagnosis not present

## 2019-02-12 DIAGNOSIS — K5730 Diverticulosis of large intestine without perforation or abscess without bleeding: Secondary | ICD-10-CM | POA: Diagnosis not present

## 2019-02-12 DIAGNOSIS — Z1211 Encounter for screening for malignant neoplasm of colon: Secondary | ICD-10-CM | POA: Diagnosis not present

## 2019-02-12 DIAGNOSIS — K635 Polyp of colon: Secondary | ICD-10-CM | POA: Diagnosis not present

## 2019-02-12 LAB — HM COLONOSCOPY

## 2019-02-15 DIAGNOSIS — R1031 Right lower quadrant pain: Secondary | ICD-10-CM | POA: Diagnosis not present

## 2019-02-15 DIAGNOSIS — N3090 Cystitis, unspecified without hematuria: Secondary | ICD-10-CM | POA: Diagnosis not present

## 2019-02-15 DIAGNOSIS — Z20828 Contact with and (suspected) exposure to other viral communicable diseases: Secondary | ICD-10-CM | POA: Diagnosis not present

## 2019-02-15 DIAGNOSIS — N3020 Other chronic cystitis without hematuria: Secondary | ICD-10-CM | POA: Diagnosis not present

## 2019-02-16 ENCOUNTER — Telehealth (INDEPENDENT_AMBULATORY_CARE_PROVIDER_SITE_OTHER): Payer: BC Managed Care – PPO | Admitting: Adult Health

## 2019-02-16 ENCOUNTER — Other Ambulatory Visit: Payer: Self-pay

## 2019-02-16 DIAGNOSIS — B349 Viral infection, unspecified: Secondary | ICD-10-CM

## 2019-02-16 MED ORDER — ONDANSETRON HCL 4 MG PO TABS: 4.0000 mg | ORAL_TABLET | Freq: Three times a day (TID) | ORAL | 0 refills | Status: DC | PRN

## 2019-02-16 NOTE — Progress Notes (Signed)
Virtual Visit via Video Note  I connected with Julia Norman  on 02/16/19 at  3:30 PM EDT by a video enabled telemedicine application and verified that I am speaking with the correct person using two identifiers.  Location patient: home Location provider:work or home office Persons participating in the virtual visit: patient, provider  I discussed the limitations of evaluation and management by telemedicine and the availability of in person appointments. The patient expressed understanding and agreed to proceed.   HPI: Is an 68 year old female who is being evaluated today for an acute issue.  Her symptoms started approximately 7 days ago.  Her symptoms include generalized abdominal pain, low-grade fever up to 99, nausea, dry heaves, and diarrhea.  4 days ago she had her scheduled colonoscopy done, despite having a low-grade fever.  Reports that her colonoscopy was normal.  This week she is also been seen by her urologist as she thought she may have a kidney stone, her urologist was unable to find a stone but sent her urine for culture she has not received these results yet.  She denies UTI-like symptoms  Additionally she went yesterday to have [facility] COVID test [facility] and has not heard the results of this yet either  During this visit she reports that her symptoms are starting to improve, this morning she did not have a fever and has not had one throughout the day, she is no longer having nausea or abdominal pain, and her last bout of diarrhea was about 3 hours ago.   ROS: See pertinent positives and negatives per HPI.  Past Medical History:  Diagnosis Date  . Cancer ([place])    skin  . Kidney stones     Past Surgical History:  Procedure Laterality Date  . CHOLECYSTECTOMY      Family History  Problem Relation Age of Onset  . Cancer Mother   . Depression Mother   . Gallbladder disease Mother   . Heart disease Father     Current Outpatient Medications:  .  aspirin EC 81 MG tablet,  Take 1 tablet (81 mg total) by mouth daily., Disp: 30 tablet, Rfl: 1 .  nitroGLYCERIN (NITROSTAT) 0.4 MG SL tablet, Place 1 tablet (0.4 mg total) under the tongue every 5 (five) minutes as needed for chest pain., Disp: 25 tablet, Rfl: 11  EXAM:  VITALS per patient if applicable:  GENERAL: alert, oriented, appears well and in no acute distress  HEENT: atraumatic, conjunttiva clear, no obvious abnormalities on inspection of external nose and ears  NECK: normal movements of the head and neck  LUNGS: on inspection no signs of respiratory distress, breathing rate appears normal, no obvious gross SOB, gasping or wheezing  CV: no obvious cyanosis  MS: moves all visible extremities without noticeable abnormality  PSYCH/NEURO: pleasant and cooperative, no obvious depression or anxiety, speech and thought processing grossly intact  ASSESSMENT AND PLAN:  Discussed the following assessment and plan:  1. Viral illness -Likely a viral GI bug.  Doubt she has a UTI or COVID at this time.  Since her symptoms are improving he was advised to rest and stay hydrated.  Will send in Zofran in case she becomes nauseous again and she can take Pepto as well.  Wait results from [place] testing and urine culture, her urologist will treat if needed. - ondansetron (ZOFRAN) 4 MG tablet; Take 1 tablet (4 mg total) by mouth every 8 (eight) hours as needed for nausea or vomiting.  Dispense: 20 tablet; Refill: 0  I discussed the assessment and treatment plan with the patient. The patient was provided an opportunity to ask questions and all were answered. The patient agreed with the plan and demonstrated an understanding of the instructions.   The patient was advised to call back or seek an in-person evaluation if the symptoms worsen or if the condition fails to improve as anticipated.   Dorothyann Peng, NP

## 2019-02-18 DIAGNOSIS — K3533 Acute appendicitis with perforation and localized peritonitis, with abscess: Secondary | ICD-10-CM | POA: Diagnosis not present

## 2019-02-18 DIAGNOSIS — F17210 Nicotine dependence, cigarettes, uncomplicated: Secondary | ICD-10-CM | POA: Diagnosis not present

## 2019-02-18 DIAGNOSIS — K3521 Acute appendicitis with generalized peritonitis, with abscess: Secondary | ICD-10-CM | POA: Diagnosis not present

## 2019-02-18 DIAGNOSIS — K3580 Unspecified acute appendicitis: Secondary | ICD-10-CM | POA: Diagnosis not present

## 2019-02-18 DIAGNOSIS — D259 Leiomyoma of uterus, unspecified: Secondary | ICD-10-CM | POA: Diagnosis not present

## 2019-02-18 DIAGNOSIS — Z23 Encounter for immunization: Secondary | ICD-10-CM | POA: Diagnosis not present

## 2019-02-18 DIAGNOSIS — E278 Other specified disorders of adrenal gland: Secondary | ICD-10-CM | POA: Diagnosis not present

## 2019-02-18 DIAGNOSIS — K3532 Acute appendicitis with perforation and localized peritonitis, without abscess: Secondary | ICD-10-CM | POA: Diagnosis not present

## 2019-02-18 DIAGNOSIS — E279 Disorder of adrenal gland, unspecified: Secondary | ICD-10-CM | POA: Diagnosis not present

## 2019-02-18 DIAGNOSIS — I700 Atherosclerosis of aorta: Secondary | ICD-10-CM | POA: Diagnosis not present

## 2019-02-18 DIAGNOSIS — K5730 Diverticulosis of large intestine without perforation or abscess without bleeding: Secondary | ICD-10-CM | POA: Diagnosis not present

## 2019-02-18 HISTORY — PX: APPENDECTOMY: SHX54

## 2019-03-07 DIAGNOSIS — Z09 Encounter for follow-up examination after completed treatment for conditions other than malignant neoplasm: Secondary | ICD-10-CM | POA: Diagnosis not present

## 2019-03-29 ENCOUNTER — Encounter: Payer: Self-pay | Admitting: Family Medicine

## 2019-04-18 ENCOUNTER — Telehealth: Payer: Self-pay | Admitting: Family Medicine

## 2019-04-18 NOTE — Telephone Encounter (Signed)
Pt stated she is now a pt of Dr. Volanda Napoleon (Chart still reflects Dr. Sherren Mocha). Pt lost her husband on 04/06/19 and is having a hard time. She would like to know if something can be sent to her pharmacy to help with this. Please advise and follow up with pt.  [id number] - [place], [address] [phone] (Phone) [phone] (Fax)

## 2019-04-19 NOTE — Telephone Encounter (Signed)
Spoke wit pt offered a virtual visit with Dr Volanda Napoleon, pt declined states that virtual visits are too expensive.Pt states that she thinks she will  be ok for now states that she will call the office if she needs an appointment.

## 2019-05-29 DIAGNOSIS — Z20828 Contact with and (suspected) exposure to other viral communicable diseases: Secondary | ICD-10-CM | POA: Diagnosis not present

## 2019-08-24 ENCOUNTER — Telehealth: Payer: Self-pay

## 2019-08-24 NOTE — Telephone Encounter (Signed)
Encounter is complete 

## 2019-08-24 NOTE — Telephone Encounter (Signed)
This encounter is complete

## 2019-11-05 ENCOUNTER — Ambulatory Visit (INDEPENDENT_AMBULATORY_CARE_PROVIDER_SITE_OTHER): Payer: BC Managed Care – PPO | Admitting: Family Medicine

## 2019-11-05 ENCOUNTER — Other Ambulatory Visit: Payer: Self-pay

## 2019-11-05 ENCOUNTER — Encounter: Payer: Self-pay | Admitting: Family Medicine

## 2019-11-05 VITALS — BP 138/80 | HR 76 | Temp 98.1°F | Wt 164.0 lb

## 2019-11-05 DIAGNOSIS — F4323 Adjustment disorder with mixed anxiety and depressed mood: Secondary | ICD-10-CM

## 2019-11-05 LAB — BASIC METABOLIC PANEL
BUN: 11 mg/dL (ref 6–23)
CO2: 29 mEq/L (ref 19–32)
Calcium: 9.2 mg/dL (ref 8.4–10.5)
Chloride: 105 mEq/L (ref 96–112)
Creatinine, Ser: 0.64 mg/dL (ref 0.40–1.20)
GFR: 92.22 mL/min (ref >60.00)
Glucose, Bld: 92 mg/dL (ref 70–99)
Potassium: 4.2 mEq/L (ref 3.5–5.1)
Sodium: 141 mEq/L (ref 135–145)

## 2019-11-05 MED ORDER — ESCITALOPRAM OXALATE 10 MG PO TABS: 10.0000 mg | ORAL_TABLET | Freq: Every day | ORAL | 3 refills | Status: DC

## 2019-11-05 NOTE — Progress Notes (Signed)
Subjective:    Patient ID: Julia Norman, female    DOB: 1950-12-03, 69 y.o.   MRN: [id number]  No chief complaint on file.   HPI Patient was seen today for acute concern.  Patient has not established care with a new provider.  Previously seen by Dr. Sherren Mocha.  Pt notes increased anxiety, depression, and difficulty concentrating at work.  Symptoms started after the death of her husband 04-14-19.  Pt worried about increased responsibility at work, now supervising several other ppl. Pt thinks she was was on Zoloft in the past, but it made her feel like "a zombie".  Past Medical History:  Diagnosis Date  . Cancer ([place])    skin  . Kidney stones     Allergies  Allergen Reactions  . Codeine     ROS General: Denies fever, chills, night sweats, changes in weight, changes in appetite + decreased concentration HEENT: Denies headaches, ear pain, changes in vision, rhinorrhea, sore throat CV: Denies CP, palpitations, SOB, orthopnea Pulm: Denies SOB, cough, wheezing GI: Denies abdominal pain, nausea, vomiting, diarrhea, constipation GU: Denies dysuria, hematuria, frequency, vaginal discharge Msk: Denies muscle cramps, joint pains Neuro: Denies weakness, numbness, tingling Skin: Denies rashes, bruising Psych: Denies hallucinations  +depression, anxiety, grief.    Objective:    Blood pressure 138/80, pulse 76, temperature 98.1 F (36.7 C), temperature source Temporal, weight 164 lb (74.4 kg), last menstrual period 06/23/2005, SpO2 98 %.   Gen. Pleasant, well-nourished, in no distress, normal affect   HEENT: [place]/AT, face symmetric, no scleral icterus, PERRLA, EOMI, nares patent without drainage. Lungs: no accessory muscle use, CTAB, no wheezes or rales Cardiovascular: RRR, no m/r/g, no peripheral edema Musculoskeletal: No deformities, no cyanosis or clubbing, normal tone Neuro:  A&Ox3, CN II-XII intact, normal gait Skin:  Warm, no lesions/ rash   Wt Readings from Last 3 Encounters:   07/05/18 160 lb (72.6 kg)  05/30/18 161 lb (73 kg)  05/11/18 159 lb (72.1 kg)    Lab Results  Component Value Date   WBC 7.8 05/11/2018   HGB 15.5 (H) 05/11/2018   HCT 45.0 05/11/2018   PLT 263.0 05/11/2018   GLUCOSE 83 05/11/2018   CHOL 212 (H) 08/07/2018   TRIG 109.0 08/07/2018   HDL 52.80 08/07/2018   LDLDIRECT 128.2 05/27/2014   LDLCALC 137 (H) 08/07/2018   ALT 15 06/01/2017   AST 16 06/01/2017   NA 141 05/11/2018   K 4.4 05/11/2018   CL 107 05/11/2018   CREATININE 0.68 05/11/2018   BUN 18 05/11/2018   CO2 28 05/11/2018   TSH 2.58 06/01/2017    Assessment/Plan:  Adjustment disorder with mixed anxiety and depressed mood -PHQ-9 score 19 -GAD-7 score 18 -Discussed various treatment options including counseling and medications -Pt interested in medications. -P encouraged to start counseling.  Given info on area providers.  Pt seems hesitant to start. -We will continue to monitor -Given precautions - Plan: TSH, T4, Free, Basic metabolic panel, escitalopram (LEXAPRO) 10 MG tablet  F/u in 4-6 wks, sooner if needed.   Pt needs to establish care with a provider as previously seen by Dr. Sherren Mocha.  Grier Mitts, MD

## 2019-11-05 NOTE — Patient Instructions (Addendum)
Dennison Bulla, [facility] is a counselor that is typically here at [place].   His number to schedule and appointment is [phone]. Managing Loss, Adult People experience loss in many different ways throughout their lives. Events such as moving, changing jobs, and losing friends can create a sense of loss. The loss may be as serious as a major health change, divorce, death of a pet, or death of a loved one. All of these types of loss are likely to create a physical and emotional reaction known as grief. Grief is the result of a major change or an absence of something or someone that you count on. Grief is a normal reaction to loss. A variety of factors can affect your grieving experience, including:  The nature of your loss.  Your relationship to what or whom you lost.  Your understanding of grief and how to manage it.  Your support system. How to manage lifestyle changes Keep to your normal routine as much as possible.  If you have trouble focusing or doing normal activities, it is acceptable to take some time away from your normal routine.  Spend time with friends and loved ones.  Eat a healthy diet, get plenty of sleep, and rest when you feel tired. How to recognize changes  The way that you deal with your grief will affect your ability to function as you normally do. When grieving, you may experience these changes:  Numbness, shock, sadness, anxiety, anger, denial, and guilt.  Thoughts about death.  Unexpected crying.  A physical sensation of emptiness in your stomach.  Problems sleeping and eating.  Tiredness (fatigue).  Loss of interest in normal activities.  Dreaming about or imagining seeing the person who died.  A need to remember what or whom you lost.  Difficulty thinking about anything other than your loss for a period of time.  Relief. If you have been expecting the loss for a while, you may feel a sense of relief when it happens. Follow these instructions at  home:  Activity Express your feelings in healthy ways, such as:  Talking with others about your loss. It may be helpful to find others who have had a similar loss, such as a support group.  Writing down your feelings in a journal.  Doing physical activities to release stress and emotional energy.  Doing creative activities like painting, sculpting, or playing or listening to music.  Practicing resilience. This is the ability to recover and adjust after facing challenges. Reading some resources that encourage resilience may help you to learn ways to practice those behaviors. General instructions  Be patient with yourself and others. Allow the grieving process to happen, and remember that grieving takes time. ? It is likely that you may never feel completely done with some grief. You may find a way to move on while still cherishing memories and feelings about your loss. ? Accepting your loss is a process. It can take months or longer to adjust.  Keep all follow-up visits as told by your health care provider. This is important. Where to find support To get support for managing loss:  Ask your health care provider for help and recommendations, such as grief counseling or therapy.  Think about joining a support group for people who are managing a loss. Where to find more information You can find more information about managing loss from:  American Society of Clinical Oncology: www.cancer.net  American Psychological Association: TVStereos.ch Contact a health care provider if:  Your grief is  extreme and keeps getting worse.  You have ongoing grief that does not improve.  Your body shows symptoms of grief, such as illness.  You feel depressed, anxious, or lonely. Get help right away if:  You have thoughts about hurting yourself or others. If you ever feel like you may hurt yourself or others, or have thoughts about taking your own life, get help right away. You can go to your  nearest emergency department or call:  Your local emergency services (911 in the [place]  A suicide crisis helpline, such as the [place] at 4424237318. This is open 24 hours a day. Summary  Grief is the result of a major change or an absence of someone or something that you count on. Grief is a normal reaction to loss.  The depth of grief and the period of recovery depend on the type of loss and your ability to adjust to the change and process your feelings.  Processing grief requires patience and a willingness to accept your feelings and talk about your loss with people who are supportive.  It is important to find resources that work for you and to realize that people experience grief differently. There is not one grieving process that works for everyone in the same way.  Be aware that when grief becomes extreme, it can lead to more severe issues like isolation, depression, anxiety, or suicidal thoughts. Talk with your health care provider if you have any of these issues. This information is not intended to replace advice given to you by your health care provider. Make sure you discuss any questions you have with your health care provider. Document Revised: 08/11/2018 Document Reviewed: 10/21/2016 Elsevier Patient Education  [place].  Adjustment Disorder, Adult Adjustment disorder is a group of symptoms that can develop after a stressful life event, such as the loss of a job or serious physical illness. The symptoms can affect how you feel, think, and act. They may interfere with your relationships. Adjustment disorder increases your risk of suicide and substance abuse. If this disorder is not managed early, it can develop into a more serious condition, such as major depressive disorder or post-traumatic stress disorder. What are the causes? This condition happens when you have trouble recovering from or coping with a stressful life event. What  increases the risk? You are more likely to develop this condition if:  You have had depression or anxiety.  You are being treated for a long-term (chronic) illness.  You are being treated for an illness that cannot be cured (terminal illness).  You have a family history of mental illness. What are the signs or symptoms? Symptoms of this condition include:  Extreme trouble doing daily tasks, such as going to work.  Sadness, depression, or crying spells.  Worrying a lot.  Loss of enjoyment.  Change in appetite or weight.  Feelings of loss or hopelessness.  Thoughts of suicide.  Anxiety, worry, or nervousness.  Trouble sleeping.  Avoiding family and friends.  Fighting or vandalism.  Complaining of feeling sick without being ill.  Feeling dazed or disconnected.  Nightmares.  Trouble sleeping.  Irritability.  Reckless driving.  Poor work Systems analyst.  Ignoring bills. Symptoms of this condition start within three months of the stressful event. They do not last more than six months, unless the stressful circumstances last longer. Normal grieving after the death of a loved one is not a symptom of this condition. How is this diagnosed? To diagnose this condition,  your health care provider will ask about what has happened in your life and how it has affected you. He or she may also ask about your medical history and your use of medicines, alcohol, and other substances. Your health care provider may do a physical exam and order lab tests or other studies. You may be referred to a mental health specialist. How is this treated? Treatment options for this condition include:  Counseling or talk therapy. Talk therapy is usually provided by mental health specialists.  Medicines. Certain medicines may help with depression, anxiety, and sleep.  Support groups. These offer emotional support, advice, and guidance. They are made up of people who have had similar  experiences.  Observation and time. This is sometimes called "watchful waiting." In this treatment, health care providers monitor your health and behavior without other treatment. Adjustment disorder sometimes gets better on its own with time. Follow these instructions at home:  Take over-the-counter and prescription medicines only as told by your health care provider.  Keep all follow-up visits as told by your health care provider. This is important. Contact a health care provider if:  Your symptoms do not improve in six months.  Your symptoms get worse. Get help right away if:  You have serious thoughts about hurting yourself or someone else. If you ever feel like you may hurt yourself or others, or have thoughts about taking your own life, get help right away. You can go to your nearest emergency department or call:  Your local emergency services (911 in the [place]  A suicide crisis helpline, such as the [place] at 934 072 5020. This is open 24 hours a day. Summary  Adjustment disorder is a group of symptoms that can develop after a stressful life event, such as the loss of a job or serious physical illness. The symptoms can affect how you feel, think, and act. They may interfere with your relationships.  Symptoms of this condition start within three months of the stressful event. They do not last more than six months, unless the stressful circumstances last longer.  Treatment may include talk therapy, medicines, participation in a support group, or observation to see if symptoms improve.  Contact your health care provider if your symptoms get worse or do not improve in six months.  If you ever feel like you may hurt yourself or others, or have thoughts about taking your own life, get help right away. This information is not intended to replace advice given to you by your health care provider. Make sure you discuss any questions you have with your  health care provider. Document Revised: 05/20/2017 Document Reviewed: 08/06/2016 Elsevier Patient Education  [place].

## 2019-11-06 LAB — TSH: TSH: 1.04 u[IU]/mL (ref 0.35–4.50)

## 2019-11-06 LAB — T4, FREE: Free T4: 0.93 ng/dL (ref 0.60–1.60)

## 2019-11-09 ENCOUNTER — Encounter: Payer: Self-pay | Admitting: Family Medicine

## 2019-11-13 ENCOUNTER — Telehealth: Payer: Self-pay | Admitting: Family Medicine

## 2019-11-13 NOTE — Telephone Encounter (Signed)
Pt is calling in with questions about her labs from 5/17 and would like to have a call back to discuss.  Pt has other questions as well and she stated that she is a pt of Dr. Volanda Napoleon but do not see any notations of her accepting her as a pt.

## 2019-11-13 NOTE — Telephone Encounter (Signed)
Spoke with pt verbalized understanding of lab results

## 2020-03-18 DIAGNOSIS — L814 Other melanin hyperpigmentation: Secondary | ICD-10-CM | POA: Diagnosis not present

## 2020-03-18 DIAGNOSIS — D485 Neoplasm of uncertain behavior of skin: Secondary | ICD-10-CM | POA: Diagnosis not present

## 2020-03-18 DIAGNOSIS — L821 Other seborrheic keratosis: Secondary | ICD-10-CM | POA: Diagnosis not present

## 2020-04-02 ENCOUNTER — Other Ambulatory Visit: Payer: Self-pay

## 2020-04-02 ENCOUNTER — Encounter: Payer: Self-pay | Admitting: Legal Medicine

## 2020-04-02 ENCOUNTER — Ambulatory Visit (INDEPENDENT_AMBULATORY_CARE_PROVIDER_SITE_OTHER): Payer: BC Managed Care – PPO | Admitting: Legal Medicine

## 2020-04-02 VITALS — BP 100/60 | HR 77 | Temp 97.7°F | Ht 66.0 in | Wt 166.4 lb

## 2020-04-02 DIAGNOSIS — Z23 Encounter for immunization: Secondary | ICD-10-CM

## 2020-04-02 DIAGNOSIS — F17200 Nicotine dependence, unspecified, uncomplicated: Secondary | ICD-10-CM | POA: Insufficient documentation

## 2020-04-02 DIAGNOSIS — I472 Ventricular tachycardia: Secondary | ICD-10-CM

## 2020-04-02 DIAGNOSIS — F17210 Nicotine dependence, cigarettes, uncomplicated: Secondary | ICD-10-CM | POA: Diagnosis not present

## 2020-04-02 DIAGNOSIS — E782 Mixed hyperlipidemia: Secondary | ICD-10-CM

## 2020-04-02 DIAGNOSIS — Z0000 Encounter for general adult medical examination without abnormal findings: Secondary | ICD-10-CM

## 2020-04-02 DIAGNOSIS — I4729 Other ventricular tachycardia: Secondary | ICD-10-CM

## 2020-04-02 DIAGNOSIS — Z6826 Body mass index (BMI) 26.0-26.9, adult: Secondary | ICD-10-CM

## 2020-04-02 DIAGNOSIS — Z1231 Encounter for screening mammogram for malignant neoplasm of breast: Secondary | ICD-10-CM

## 2020-04-02 DIAGNOSIS — R0989 Other specified symptoms and signs involving the circulatory and respiratory systems: Secondary | ICD-10-CM | POA: Diagnosis not present

## 2020-04-02 NOTE — Patient Instructions (Signed)
Preventive Care 68 Years and Older, Female Preventive care refers to lifestyle choices and visits with your health care provider that can promote health and wellness. This includes:  A yearly physical exam. This is also called an annual well check.  Regular dental and eye exams.  Immunizations.  Screening for certain conditions.  Healthy lifestyle choices, such as diet and exercise. What can I expect for my preventive care visit? Physical exam Your health care provider will check:  Height and weight. These may be used to calculate body mass index (BMI), which is a measurement that tells if you are at a healthy weight.  Heart rate and blood pressure.  Your skin for abnormal spots. Counseling Your health care provider may ask you questions about:  Alcohol, tobacco, and drug use.  Emotional well-being.  Home and relationship well-being.  Sexual activity.  Eating habits.  History of falls.  Memory and ability to understand (cognition).  Work and work Statistician.  Pregnancy and menstrual history. What immunizations do I need?  Influenza (flu) vaccine  This is recommended every year. Tetanus, diphtheria, and pertussis (Tdap) vaccine  You may need a Td booster every 10 years. Varicella (chickenpox) vaccine  You may need this vaccine if you have not already been vaccinated. Zoster (shingles) vaccine  You may need this after age 68. Pneumococcal conjugate (PCV13) vaccine  One dose is recommended after age 68. Pneumococcal polysaccharide (PPSV23) vaccine  One dose is recommended after age 68. Measles, mumps, and rubella (MMR) vaccine  You may need at least one dose of MMR if you were born in 1957 or later. You may also need a second dose. Meningococcal conjugate (MenACWY) vaccine  You may need this if you have certain conditions. Hepatitis A vaccine  You may need this if you have certain conditions or if you travel or work in places where you may be exposed  to hepatitis A. Hepatitis B vaccine  You may need this if you have certain conditions or if you travel or work in places where you may be exposed to hepatitis B. Haemophilus influenzae type b (Hib) vaccine  You may need this if you have certain conditions. You may receive vaccines as individual doses or as more than one vaccine together in one shot (combination vaccines). Talk with your health care provider about the risks and benefits of combination vaccines. What tests do I need? Blood tests  Lipid and cholesterol levels. These may be checked every 5 years, or more frequently depending on your overall health.  Hepatitis C test.  Hepatitis B test. Screening  Lung cancer screening. You may have this screening every year starting at age 68 if you have a 30-pack-year history of smoking and currently smoke or have quit within the past 15 years.  Colorectal cancer screening. All adults should have this screening starting at age 68 and continuing until age 15. Your health care provider may recommend screening at age 23 if you are at increased risk. You will have tests every 1-10 years, depending on your results and the type of screening test.  Diabetes screening. This is done by checking your blood sugar (glucose) after you have not eaten for a while (fasting). You may have this done every 1-3 years.  Mammogram. This may be done every 1-2 years. Talk with your health care provider about how often you should have regular mammograms.  BRCA-related cancer screening. This may be done if you have a family history of breast, ovarian, tubal, or peritoneal cancers.  Other tests  Sexually transmitted disease (STD) testing.  Bone density scan. This is done to screen for osteoporosis. You may have this done starting at age 68. Follow these instructions at home: Eating and drinking  Eat a diet that includes fresh fruits and vegetables, whole grains, lean protein, and low-fat dairy products. Limit  your intake of foods with high amounts of sugar, saturated fats, and salt.  Take vitamin and mineral supplements as recommended by your health care provider.  Do not drink alcohol if your health care provider tells you not to drink.  If you drink alcohol: ? Limit how much you have to 0-1 drink a day. ? Be aware of how much alcohol is in your drink. In the [place] one drink equals one 12 oz bottle of beer (355 mL), one 5 oz glass of wine (148 mL), or one 1 oz glass of hard liquor (44 mL). Lifestyle  Take daily care of your teeth and gums.  Stay active. Exercise for at least 30 minutes on 5 or more days each week.  Do not use any products that contain nicotine or tobacco, such as cigarettes, e-cigarettes, and chewing tobacco. If you need help quitting, ask your health care provider.  If you are sexually active, practice safe sex. Use a condom or other form of protection in order to prevent STIs (sexually transmitted infections).  Talk with your health care provider about taking a low-dose aspirin or statin. What's next?  Go to your health care provider once a year for a well check visit.  Ask your health care provider how often you should have your eyes and teeth checked.  Stay up to date on all vaccines. This information is not intended to replace advice given to you by your health care provider. Make sure you discuss any questions you have with your health care provider. Document Revised: 06/01/2018 Document Reviewed: 06/01/2018 Elsevier Patient Education  2020 [employer].

## 2020-04-02 NOTE — Progress Notes (Signed)
New Patient Office Visit  Subjective:  Patient ID: Julia Norman, female    DOB: October 15, 1950  Age: 69 y.o. MRN: [id number]  CC:  Chief Complaint  Patient presents with   Establish Care    Physical-pt is fasting    HPI Julia Norman presents for new patient for PE.  She is having no problems. Her husband diet last year and could not tolerate zoloft.  She is doing well.  She has history on nonsustained VTacky.  She has insomnia and worries about using computer.  She has some IBS with diarrhea.  Patient presents with hyperlipidemia.  Compliance with treatment has been good; patient takes medicines as directed, maintains low cholesterol diet, follows up as directed, and maintains exercise regimen.  Patient is using none without problems. She lost weight and recently gained 20 lbs.  She continues to smoke 1/2 PPD. We discussed smoking cesation  Past Medical History:  Diagnosis Date   Cancer ([place])    skin   Kidney stones     Past Surgical History:  Procedure Laterality Date   APPENDECTOMY  02/18/2019   CHOLECYSTECTOMY      Family History  Problem Relation Age of Onset   Cancer Mother        lymphoma   Depression Mother    Gallbladder disease Mother    Heart disease Father     Social History   Socioeconomic History   Marital status: Widowed    Spouse name: Not on file   Number of children: 2   Years of education: Not on file   Highest education level: Not on file  Occupational History   Occupation: customer service  Tobacco Use   Smoking status: Current Every Day Smoker   Smokeless tobacco: Never Used  Substance and Sexual Activity   Alcohol use: Not Currently   Drug use: No   Sexual activity: Yes    Partners: Male  Other Topics Concern   Not on file  Social History Narrative   Not on file   Social Determinants of Health   Financial Resource Strain:    Difficulty of Paying Living Expenses: Not on file  Food Insecurity:     Worried About [place] in the Last Year: Not on file   Ran Out of Food in the Last Year: Not on file  Transportation Needs:    Lack of Transportation (Medical): Not on file   Lack of Transportation (Non-Medical): Not on file  Physical Activity:    Days of Exercise per Week: Not on file   Minutes of Exercise per Session: Not on file  Stress:    Feeling of Stress : Not on file  Social Connections:    Frequency of Communication with Friends and Family: Not on file   Frequency of Social Gatherings with Friends and Family: Not on file   Attends Religious Services: Not on file   Active Member of Clubs or Organizations: Not on file   Attends Archivist Meetings: Not on file   Marital Status: Not on file  Intimate Partner Violence:    Fear of Current or Ex-Partner: Not on file   Emotionally Abused: Not on file   Physically Abused: Not on file   Sexually Abused: Not on file    ROS Review of Systems  Constitutional: Negative.  Negative for activity change and appetite change.  HENT: Negative.   Eyes: Negative.   Respiratory: Negative for cough, shortness of breath and stridor.  Cardiovascular: Negative for chest pain, palpitations and leg swelling.  Gastrointestinal: Negative.   Endocrine: Negative.   Genitourinary: Negative.   Skin: Negative.   Neurological: Negative.   Psychiatric/Behavioral: Positive for dysphoric mood.    Objective:   Today's Vitals: BP 100/60    Pulse 77    Temp 97.7 F (36.5 C)    Ht 5\' 6"  (1.676 m)    Wt 166 lb 6.4 oz (75.5 kg)    LMP 06/23/2005 (Approximate) Comment: 10 years ago    SpO2 96%    BMI 26.86 kg/m   Physical Exam Vitals reviewed.  Constitutional:      Appearance: Normal appearance.  HENT:     Head: Normocephalic and atraumatic.     Right Ear: Tympanic membrane, ear canal and external ear normal.     Left Ear: Tympanic membrane, ear canal and external ear normal.     Nose: Nose normal.      Mouth/Throat:     Mouth: Mucous membranes are moist.     Pharynx: Oropharynx is clear.  Eyes:     Extraocular Movements: Extraocular movements intact.     Conjunctiva/sclera: Conjunctivae normal.     Pupils: Pupils are equal, round, and reactive to light.  Cardiovascular:     Rate and Rhythm: Normal rate and regular rhythm.     Pulses: Normal pulses.     Heart sounds: Normal heart sounds.  Pulmonary:     Effort: Pulmonary effort is normal.     Breath sounds: Normal breath sounds.  Abdominal:     General: Abdomen is flat. Bowel sounds are normal.     Palpations: Abdomen is soft.  Musculoskeletal:        General: Normal range of motion.     Cervical back: Normal range of motion and neck supple.  Skin:    General: Skin is warm and dry.     Capillary Refill: Capillary refill takes less than 2 seconds.  Neurological:     General: No focal deficit present.     Mental Status: She is alert and oriented to person, place, and time.  Psychiatric:        Mood and Affect: Mood normal.        Behavior: Behavior normal.        Thought Content: Thought content normal.    Depression screen [facility] 2/9 04/02/2020 11/05/2019  Decreased Interest 1 2  Down, Depressed, Hopeless 1 2  PHQ - 2 Score 2 4  Altered sleeping 1 2  Tired, decreased energy 1 3  Change in appetite 1 3  Feeling bad or failure about yourself  1 3  Trouble concentrating 1 2  Moving slowly or fidgety/restless 1 2  Suicidal thoughts 0 0  PHQ-9 Score 8 19  Difficult doing work/chores Somewhat difficult Very difficult     Assessment & Plan:   Problem List Items Addressed This Visit      Cardiovascular and Mediastinum   Nonsustained ventricular tachycardia ([place]) - Primary   Relevant Orders   CBC with Differential/Platelet   Comprehensive metabolic panel Patient has an old history of nonsustained V-tachy and followed by Cardiology     Other   Routine general medical examination at a health care facility   Relevant  Orders   TSH Routine Physical and wellness instructions    Hyperlipidemia   Relevant Orders   CBC with Differential/Platelet   Lipid panel AN INDIVIDUAL CARE PLAN for hyperlipidemia/ cholesterol was established and reinforced today.  The patient's  status was assessed using clinical findings on exam, lab and other diagnostic tests. The patient's disease status was assessed based on evidence-based guidelines and found to be well controlled. MEDICATIONS were reviewed. SELF MANAGEMENT GOALS have been discussed and patient's success at attaining the goal of low cholesterol was assessed. RECOMMENDATION given include regular exercise 3 days a week and low cholesterol/low fat diet. CLINICAL SUMMARY including written plan to identify barriers unique to the patient due to social or economic  reasons was discussed.    Bilateral carotid bruits   Relevant Orders   CBC with Differential/Platelet   CAROTID ULTRASOUND (BACK OFFICE) Patient found to have carotid bruits bilaterally but never follow ed, no TIA or neurologic symptoms.  Dopplers will be performed for completeness    Nicotine dependence Individual plan was given to patient based on exam, history and other tests and using evidence based criteria for care for smoking.  We discussed behavioral changes to help cessation and offered medicines to aid in quitting.  Medical consequences of tobacco use were explained.    BMI 26.0-26.9,adult BMI stable and reasonable.    Other Visit Diagnoses    Encounter for screening mammogram for malignant neoplasm of breast       Relevant Orders   MM Digital Screening   Need for immunization against influenza       Relevant Orders   Flu Vaccine QUAD High Dose(Fluad) (Completed)   Need for pneumococcal vaccination       Relevant Orders   Pneumococcal conjugate vaccine 13-valent IM (Completed)      Outpatient Encounter Medications as of 04/02/2020  Medication Sig   [DISCONTINUED] aspirin EC 81 MG tablet  Take 1 tablet (81 mg total) by mouth daily. (Patient not taking: Reported on 04/02/2020)   [DISCONTINUED] escitalopram (LEXAPRO) 10 MG tablet Take 1 tablet (10 mg total) by mouth daily. (Patient not taking: Reported on 04/02/2020)   [DISCONTINUED] nitroGLYCERIN (NITROSTAT) 0.4 MG SL tablet Place 1 tablet (0.4 mg total) under the tongue every 5 (five) minutes as needed for chest pain. (Patient not taking: Reported on 04/02/2020)   [DISCONTINUED] ondansetron (ZOFRAN) 4 MG tablet Take 1 tablet (4 mg total) by mouth every 8 (eight) hours as needed for nausea or vomiting. (Patient not taking: Reported on 04/02/2020)   No facility-administered encounter medications on file as of 04/02/2020.    Follow-up: Return in about 6 months (around 10/01/2020) for fasting.   Reinaldo Meeker, MD

## 2020-04-03 LAB — COMPREHENSIVE METABOLIC PANEL
ALT: 14 IU/L (ref 0–32)
AST: 16 IU/L (ref 0–40)
Albumin/Globulin Ratio: 1.8 (ref 1.2–2.2)
Albumin: 4.2 g/dL (ref 3.8–4.8)
Alkaline Phosphatase: 67 IU/L (ref 44–121)
BUN/Creatinine Ratio: 17 (ref 12–28)
BUN: 11 mg/dL (ref 8–27)
Bilirubin Total: 0.4 mg/dL (ref 0.0–1.2)
CO2: 22 mmol/L (ref 20–29)
Calcium: 9.5 mg/dL (ref 8.7–10.3)
Chloride: 104 mmol/L (ref 96–106)
Creatinine, Ser: 0.64 mg/dL (ref 0.57–1.00)
GFR calc Af Amer: 106 mL/min/{1.73_m2} (ref >59)
GFR calc non Af Amer: 92 mL/min/{1.73_m2} (ref >59)
Globulin, Total: 2.3 g/dL (ref 1.5–4.5)
Glucose: 82 mg/dL (ref 65–99)
Potassium: 4.3 mmol/L (ref 3.5–5.2)
Sodium: 140 mmol/L (ref 134–144)
Total Protein: 6.5 g/dL (ref 6.0–8.5)

## 2020-04-03 LAB — CBC WITH DIFFERENTIAL/PLATELET
Basophils Absolute: 0.1 10*3/uL (ref 0.0–0.2)
Basos: 1 %
EOS (ABSOLUTE): 0.2 10*3/uL (ref 0.0–0.4)
Eos: 2 %
Hematocrit: 45.5 % (ref 34.0–46.6)
Hemoglobin: 15.2 g/dL (ref 11.1–15.9)
Immature Grans (Abs): 0 10*3/uL (ref 0.0–0.1)
Immature Granulocytes: 0 %
Lymphocytes Absolute: 2.5 10*3/uL (ref 0.7–3.1)
Lymphs: 28 %
MCH: 31.9 pg (ref 26.6–33.0)
MCHC: 33.4 g/dL (ref 31.5–35.7)
MCV: 95 fL (ref 79–97)
Monocytes Absolute: 0.7 10*3/uL (ref 0.1–0.9)
Monocytes: 8 %
Neutrophils Absolute: 5.4 10*3/uL (ref 1.4–7.0)
Neutrophils: 61 %
Platelets: 259 10*3/uL (ref 150–450)
RBC: 4.77 x10E6/uL (ref 3.77–5.28)
RDW: 12.7 % (ref 11.7–15.4)
WBC: 8.9 10*3/uL (ref 3.4–10.8)

## 2020-04-03 LAB — LIPID PANEL
Chol/HDL Ratio: 3.5 ratio (ref 0.0–4.4)
Cholesterol, Total: 220 mg/dL — ABNORMAL HIGH (ref 100–199)
HDL: 62 mg/dL (ref >39)
LDL Chol Calc (NIH): 138 mg/dL — ABNORMAL HIGH (ref 0–99)
Triglycerides: 113 mg/dL (ref 0–149)
VLDL Cholesterol Cal: 20 mg/dL (ref 5–40)

## 2020-04-03 LAB — TSH: TSH: 2.2 u[IU]/mL (ref 0.450–4.500)

## 2020-04-03 LAB — CARDIOVASCULAR RISK ASSESSMENT

## 2020-04-03 NOTE — Progress Notes (Signed)
Cbc normal, kidney and liver tests normal LDL-c 138 high consider statin to lower cardiac risk lp

## 2020-04-17 DIAGNOSIS — C44722 Squamous cell carcinoma of skin of right lower limb, including hip: Secondary | ICD-10-CM | POA: Diagnosis not present

## 2020-04-21 DIAGNOSIS — Z1231 Encounter for screening mammogram for malignant neoplasm of breast: Secondary | ICD-10-CM | POA: Diagnosis not present

## 2020-04-23 DIAGNOSIS — I6523 Occlusion and stenosis of bilateral carotid arteries: Secondary | ICD-10-CM | POA: Diagnosis not present

## 2020-04-23 DIAGNOSIS — R0989 Other specified symptoms and signs involving the circulatory and respiratory systems: Secondary | ICD-10-CM | POA: Diagnosis not present

## 2020-05-06 ENCOUNTER — Other Ambulatory Visit: Payer: Self-pay

## 2020-05-06 DIAGNOSIS — R0989 Other specified symptoms and signs involving the circulatory and respiratory systems: Secondary | ICD-10-CM

## 2020-05-07 DIAGNOSIS — L02415 Cutaneous abscess of right lower limb: Secondary | ICD-10-CM | POA: Diagnosis not present

## 2020-05-28 DIAGNOSIS — C44722 Squamous cell carcinoma of skin of right lower limb, including hip: Secondary | ICD-10-CM | POA: Diagnosis not present

## 2020-06-18 ENCOUNTER — Ambulatory Visit (INDEPENDENT_AMBULATORY_CARE_PROVIDER_SITE_OTHER): Payer: BC Managed Care – PPO

## 2020-06-18 DIAGNOSIS — Z20822 Contact with and (suspected) exposure to covid-19: Secondary | ICD-10-CM

## 2020-06-18 LAB — POC COVID19 BINAXNOW: SARS Coronavirus 2 Ag: NEGATIVE

## 2020-06-18 NOTE — Progress Notes (Signed)
° ° °  °  Patient was tested by RAPID COVID TEST from car due to positive COVID exposure.  Results for orders placed or performed in visit on 06/18/20 (from the past 24 hour(s))  POC COVID-19     Status: Normal   Collection Time: 06/18/20  9:34 AM  Result Value Ref Range   SARS Coronavirus 2 Ag Negative Negative     Creola Corn, LPN 60/10/93 2:35 AM

## 2020-06-26 ENCOUNTER — Encounter: Payer: Self-pay | Admitting: Legal Medicine

## 2020-06-26 ENCOUNTER — Telehealth (INDEPENDENT_AMBULATORY_CARE_PROVIDER_SITE_OTHER): Payer: BC Managed Care – PPO | Admitting: Legal Medicine

## 2020-06-26 VITALS — Temp 99.5°F | Ht 63.0 in | Wt 165.0 lb

## 2020-06-26 DIAGNOSIS — Z9189 Other specified personal risk factors, not elsewhere classified: Secondary | ICD-10-CM

## 2020-06-26 DIAGNOSIS — Z20822 Contact with and (suspected) exposure to covid-19: Secondary | ICD-10-CM

## 2020-06-26 LAB — POC COVID19 BINAXNOW: SARS Coronavirus 2 Ag: NEGATIVE

## 2020-06-26 MED ORDER — AMOXICILLIN 875 MG PO TABS: 875.0000 mg | ORAL_TABLET | Freq: Two times a day (BID) | ORAL | 0 refills | Status: DC

## 2020-06-26 NOTE — Progress Notes (Signed)
Virtual Visit via Telephone Note   This visit type was conducted due to national recommendations for restrictions regarding the COVID-19 Pandemic (e.g. social distancing) in an effort to limit this patient's exposure and mitigate transmission in our community.  Due to her co-morbid illnesses, this patient is at least at moderate risk for complications without adequate follow up.  This format is felt to be most appropriate for this patient at this time.  The patient did not have access to video technology/had technical difficulties with video requiring transitioning to audio format only (telephone).  All issues noted in this document were discussed and addressed.  No physical exam could be performed with this format.  Patient verbally consented to a telehealth visit.   Date:  06/26/2020   ID:  Julia Norman, DOB 1950/08/23, MRN [id number]  Patient Location: Home Provider Location: Office/Clinic  PCP:  Lillard Anes, MD   Evaluation Performed:  New Patient Evaluation  Chief Complaint:  2 days with cough, negative covid on 06/19/2020.  Coughing yellow phlegm, fever  History of Present Illness:    Julia Norman is a 69 y.o. female with 2 days with cough, negative covid on 06/19/2020.  Coughing yellow phlegm, fever, exposed to covid.    The patient does have symptoms concerning for COVID-19 infection (fever, chills, cough, or new shortness of breath).    Past Medical History:  Diagnosis Date   Cancer [facility])    skin   Kidney stones     Past Surgical History:  Procedure Laterality Date   APPENDECTOMY  02/18/2019   CHOLECYSTECTOMY      Family History  Problem Relation Age of Onset   Cancer Mother        lymphoma   Depression Mother    Gallbladder disease Mother    Heart disease Father     Social History   Socioeconomic History   Marital status: Widowed    Spouse name: Not on file   Number of children: 2   Years of education: Not on file   Highest  education level: Not on file  Occupational History   Occupation: customer service  Tobacco Use   Smoking status: Current Every Day Smoker    Packs/day: 0.50    Types: Cigarettes   Smokeless tobacco: Never Used  Substance and Sexual Activity   Alcohol use: Not Currently   Drug use: No   Sexual activity: Yes    Partners: Male  Other Topics Concern   Not on file  Social History Narrative   Not on file   Social Determinants of Health   Financial Resource Strain: Not on file  Food Insecurity: Not on file  Transportation Needs: Not on file  Physical Activity: Not on file  Stress: Not on file  Social Connections: Not on file  Intimate Partner Violence: Not on file    No outpatient medications prior to visit.   No facility-administered medications prior to visit.    Allergies:   Codeine   Social History   Tobacco Use   Smoking status: Current Every Day Smoker    Packs/day: 0.50    Types: Cigarettes   Smokeless tobacco: Never Used  Substance Use Topics   Alcohol use: Not Currently   Drug use: No     Review of Systems  Constitutional: Positive for chills, fever and malaise/fatigue.  HENT: Positive for congestion and sinus pain.   Eyes: Negative for redness.  Respiratory: Positive for cough and sputum production.   Cardiovascular:  Negative for chest pain.  Gastrointestinal: Negative for melena.  Genitourinary: Negative for dysuria.  Musculoskeletal: Positive for myalgias.  Skin: Negative for itching and rash.  Neurological: Positive for headaches.  Psychiatric/Behavioral: Negative.      Labs/Other Tests and Data Reviewed:    Recent Labs: 04/02/2020: ALT 14; BUN 11; Creatinine, Ser 0.64; Hemoglobin 15.2; Platelets 259; Potassium 4.3; Sodium 140; TSH 2.200   Recent Lipid Panel Lab Results  Component Value Date/Time   CHOL 220 (H) 04/02/2020 04:04 PM   TRIG 113 04/02/2020 04:04 PM   HDL 62 04/02/2020 04:04 PM   CHOLHDL 3.5 04/02/2020 04:04 PM    CHOLHDL 4 08/07/2018 08:31 AM   LDLCALC 138 (H) 04/02/2020 04:04 PM   LDLDIRECT 128.2 05/27/2014 09:12 AM    Wt Readings from Last 3 Encounters:  06/26/20 165 lb (74.8 kg)  04/02/20 166 lb 6.4 oz (75.5 kg)  11/05/19 164 lb (74.4 kg)     Objective:    Vital Signs:  Temp 99.5 F (37.5 C)    Ht 5\' 3"  (1.6 m)    Wt 165 lb (74.8 kg)    LMP 06/23/2005 (Approximate) Comment: 10 years ago    BMI 29.23 kg/m    Physical Exam vs reviewed  ASSESSMENT & PLAN:   Diagnoses and all orders for this visit: At increased risk of exposure to COVID-19 virus -     POC COVID-19 negative        COVID-19 Education: The signs and symptoms of COVID-19 were discussed with the patient and how to seek care for testing (follow up with PCP or arrange E-visit). The importance of social distancing was discussed today.   I spent 15 minutes dedicated to the care of this patient on the date of this encounter to include face-to-face time with the patient, as well as:   Follow Up:  In Person prn  Signed,  08/21/2005, MD  06/26/2020 2:02 PM    [facility] [place]

## 2020-06-26 NOTE — Progress Notes (Signed)
Called in amoxicillin for sinus lp

## 2020-06-28 ENCOUNTER — Encounter: Payer: Self-pay | Admitting: Legal Medicine

## 2020-07-02 DIAGNOSIS — C44622 Squamous cell carcinoma of skin of right upper limb, including shoulder: Secondary | ICD-10-CM | POA: Diagnosis not present

## 2020-10-02 ENCOUNTER — Ambulatory Visit: Payer: BC Managed Care – PPO | Admitting: Legal Medicine

## 2020-10-09 ENCOUNTER — Ambulatory Visit (INDEPENDENT_AMBULATORY_CARE_PROVIDER_SITE_OTHER): Payer: BC Managed Care – PPO | Admitting: Legal Medicine

## 2020-10-09 ENCOUNTER — Other Ambulatory Visit: Payer: Self-pay

## 2020-10-09 ENCOUNTER — Encounter: Payer: Self-pay | Admitting: Legal Medicine

## 2020-10-09 VITALS — BP 110/60 | HR 79 | Temp 96.7°F | Resp 16 | Ht 63.0 in | Wt 167.0 lb

## 2020-10-09 DIAGNOSIS — I472 Ventricular tachycardia: Secondary | ICD-10-CM

## 2020-10-09 DIAGNOSIS — F17210 Nicotine dependence, cigarettes, uncomplicated: Secondary | ICD-10-CM

## 2020-10-09 DIAGNOSIS — E782 Mixed hyperlipidemia: Secondary | ICD-10-CM

## 2020-10-09 DIAGNOSIS — H00011 Hordeolum externum right upper eyelid: Secondary | ICD-10-CM

## 2020-10-09 DIAGNOSIS — I4729 Other ventricular tachycardia: Secondary | ICD-10-CM

## 2020-10-09 MED ORDER — CIPROFLOXACIN HCL 0.3 % OP SOLN: 2.0000 [drp] | OPHTHALMIC | 0 refills | Status: DC

## 2020-10-09 NOTE — Progress Notes (Signed)
Subjective:  Patient ID: Julia Norman, female    DOB: [birth year]  Age: 70 y.o. MRN: [id number]  Chief Complaint  Patient presents with  . Hyperlipidemia  . Tachycardia    HPI: chronic visit  Patient presents with hyperlipidemia.  Compliance with treatment has been good; patient takes medicines as directed, maintains low cholesterol diet, follows up as directed, and maintains exercise regimen.  Patient is using diet without problems.  Ear wax problem, needs check, left ear needs lavage  OD is red and swollen for one week.   No current outpatient medications on file prior to visit.   No current facility-administered medications on file prior to visit.   Past Medical History:  Diagnosis Date  . Cancer ([place])    skin  . Kidney stones    Past Surgical History:  Procedure Laterality Date  . APPENDECTOMY  02/18/2019  . CHOLECYSTECTOMY      Family History  Problem Relation Age of Onset  . Cancer Mother        lymphoma  . Depression Mother   . Gallbladder disease Mother   . Heart disease Father    Social History   Socioeconomic History  . Marital status: Widowed    Spouse name: Not on file  . Number of children: 2  . Years of education: Not on file  . Highest education level: Not on file  Occupational History  . Occupation: customer service  Tobacco Use  . Smoking status: Current Every Day Smoker    Packs/day: 0.50    Types: Cigarettes  . Smokeless tobacco: Never Used  Substance and Sexual Activity  . Alcohol use: Yes    Comment: ocassionally  . Drug use: No  . Sexual activity: Yes    Partners: Male  Other Topics Concern  . Not on file  Social History Narrative  . Not on file   Social Determinants of Health   Financial Resource Strain: Not on file  Food Insecurity: Not on file  Transportation Needs: Not on file  Physical Activity: Not on file  Stress: Not on file  Social Connections: Not on file    Review of Systems  Constitutional: Negative for  activity change and appetite change.  HENT: Negative for congestion.   Eyes: Positive for redness. Negative for visual disturbance.  Respiratory: Negative for chest tightness and shortness of breath.   Cardiovascular: Negative for chest pain, palpitations and leg swelling.  Gastrointestinal: Negative for abdominal distention and abdominal pain.  Genitourinary: Negative for difficulty urinating and dyspareunia.  Musculoskeletal: Negative for arthralgias and back pain.  Skin: Negative.   Neurological: Negative.   Psychiatric/Behavioral: Negative.      Objective:  BP 110/60   Pulse 79   Temp (!) 96.7 F (35.9 C)   Resp 16   Ht 5\' 3"  (1.6 m)   Wt 167 lb (75.8 kg)   LMP 06/23/2005 (Approximate) Comment: 10 years ago   SpO2 98%   BMI 29.58 kg/m   BP/Weight 10/09/2020 06/26/2020 51/07/5850  Systolic BP 778 - 242  Diastolic BP 60 - 60  Wt. (Lbs) 167 165 166.4  BMI 29.58 29.23 26.86    Physical Exam Vitals reviewed.  Constitutional:      Appearance: Normal appearance. She is normal weight.  HENT:     Head: Normocephalic and atraumatic.     Right Ear: There is impacted cerumen.     Left Ear: Tympanic membrane, ear canal and external ear normal.  Eyes:  Extraocular Movements: Extraocular movements intact.     Conjunctiva/sclera: Conjunctivae normal.     Pupils: Pupils are equal, round, and reactive to light.     Comments: Redness right upper lid with stye present  Cardiovascular:     Rate and Rhythm: Normal rate and regular rhythm.     Pulses: Normal pulses.     Heart sounds: Normal heart sounds. No murmur heard. No gallop.   Pulmonary:     Effort: Pulmonary effort is normal. No respiratory distress.     Breath sounds: Normal breath sounds. No rales.  Abdominal:     General: Abdomen is flat. Bowel sounds are normal.     Palpations: Abdomen is soft.  Skin:    Capillary Refill: Capillary refill takes less than 2 seconds.  Neurological:     General: No focal deficit  present.     Mental Status: She is alert.       Lab Results  Component Value Date   WBC 8.9 04/02/2020   HGB 15.2 04/02/2020   HCT 45.5 04/02/2020   PLT 259 04/02/2020   GLUCOSE 82 04/02/2020   CHOL 220 (H) 04/02/2020   TRIG 113 04/02/2020   HDL 62 04/02/2020   LDLDIRECT 128.2 05/27/2014   LDLCALC 138 (H) 04/02/2020   ALT 14 04/02/2020   AST 16 04/02/2020   NA 140 04/02/2020   K 4.3 04/02/2020   CL 104 04/02/2020   CREATININE 0.64 04/02/2020   BUN 11 04/02/2020   CO2 22 04/02/2020   TSH 2.200 04/02/2020      Assessment & Plan:  Diagnoses and all orders for this visit: Mixed hyperlipidemia -     Comprehensive metabolic panel -     Lipid Panel AN INDIVIDUAL CARE PLAN for hyperlipidemia/ cholesterol was established and reinforced today.  The patient's status was assessed using clinical findings on exam, lab and other diagnostic tests. The patient's disease status was assessed based on evidence-based guidelines and found to be fair controlled. MEDICATIONS were reviewed. SELF MANAGEMENT GOALS have been discussed and patient's success at attaining the goal of low cholesterol was assessed. RECOMMENDATION given include regular exercise 3 days a week and low cholesterol/low fat diet. CLINICAL SUMMARY including written plan to identify barriers unique to the patient due to social or economic  reasons was discussed.  Nonsustained ventricular tachycardia (HCC) -     CBC with Differential Patients doing well and does not have any recurrance  Cigarette smoker Individual plan was given to patient based on exam, history and other tests and using evidence based criteria for care for smoking cessation.  We discussed behavioral changes to help cessation and offered counseling to aid in quitting.  Medical consequences of tobacco use were explained.  Hordeolum externum of right upper eyelid -     ciprofloxacin (CILOXAN) 0.3 % ophthalmic solution; Place 2 drops into both eyes every 2  (two) hours. Administer 1 drop, every 2 hours, while awake, for 2 days. Then 1 drop, every 4 hours, while awake, for the next 5 days. Use warm compresses and medicines as prescribed         Follow-up: Return if symptoms worsen or fail to improve.  An After Visit Summary was printed and given to the patient.  Reinaldo Meeker, MD [facility] [phone]

## 2020-10-10 LAB — CBC WITH DIFFERENTIAL/PLATELET
Basophils Absolute: 0 10*3/uL (ref 0.0–0.2)
Basos: 1 %
EOS (ABSOLUTE): 0.1 10*3/uL (ref 0.0–0.4)
Eos: 2 %
Hematocrit: 47.3 % — ABNORMAL HIGH (ref 34.0–46.6)
Hemoglobin: 16 g/dL — ABNORMAL HIGH (ref 11.1–15.9)
Immature Grans (Abs): 0 10*3/uL (ref 0.0–0.1)
Immature Granulocytes: 0 %
Lymphocytes Absolute: 1.9 10*3/uL (ref 0.7–3.1)
Lymphs: 23 %
MCH: 31.4 pg (ref 26.6–33.0)
MCHC: 33.8 g/dL (ref 31.5–35.7)
MCV: 93 fL (ref 79–97)
Monocytes Absolute: 0.7 10*3/uL (ref 0.1–0.9)
Monocytes: 9 %
Neutrophils Absolute: 5.4 10*3/uL (ref 1.4–7.0)
Neutrophils: 65 %
Platelets: 277 10*3/uL (ref 150–450)
RBC: 5.1 x10E6/uL (ref 3.77–5.28)
RDW: 13.1 % (ref 11.7–15.4)
WBC: 8.2 10*3/uL (ref 3.4–10.8)

## 2020-10-10 LAB — COMPREHENSIVE METABOLIC PANEL
ALT: 17 IU/L (ref 0–32)
AST: 18 IU/L (ref 0–40)
Albumin/Globulin Ratio: 1.9 (ref 1.2–2.2)
Albumin: 4.4 g/dL (ref 3.8–4.8)
Alkaline Phosphatase: 64 IU/L (ref 44–121)
BUN/Creatinine Ratio: 24 (ref 12–28)
BUN: 17 mg/dL (ref 8–27)
Bilirubin Total: 0.2 mg/dL (ref 0.0–1.2)
CO2: 22 mmol/L (ref 20–29)
Calcium: 9.6 mg/dL (ref 8.7–10.3)
Chloride: 104 mmol/L (ref 96–106)
Creatinine, Ser: 0.72 mg/dL (ref 0.57–1.00)
Globulin, Total: 2.3 g/dL (ref 1.5–4.5)
Glucose: 87 mg/dL (ref 65–99)
Potassium: 5 mmol/L (ref 3.5–5.2)
Sodium: 142 mmol/L (ref 134–144)
Total Protein: 6.7 g/dL (ref 6.0–8.5)
eGFR: 90 mL/min/{1.73_m2} (ref >59)

## 2020-10-10 LAB — LIPID PANEL
Chol/HDL Ratio: 4 ratio (ref 0.0–4.4)
Cholesterol, Total: 213 mg/dL — ABNORMAL HIGH (ref 100–199)
HDL: 53 mg/dL (ref >39)
LDL Chol Calc (NIH): 139 mg/dL — ABNORMAL HIGH (ref 0–99)
Triglycerides: 120 mg/dL (ref 0–149)
VLDL Cholesterol Cal: 21 mg/dL (ref 5–40)

## 2020-10-10 LAB — CARDIOVASCULAR RISK ASSESSMENT

## 2021-04-01 DIAGNOSIS — D2239 Melanocytic nevi of other parts of face: Secondary | ICD-10-CM | POA: Diagnosis not present

## 2021-04-01 DIAGNOSIS — D225 Melanocytic nevi of trunk: Secondary | ICD-10-CM | POA: Diagnosis not present

## 2021-04-01 DIAGNOSIS — L814 Other melanin hyperpigmentation: Secondary | ICD-10-CM | POA: Diagnosis not present

## 2021-04-01 DIAGNOSIS — D485 Neoplasm of uncertain behavior of skin: Secondary | ICD-10-CM | POA: Diagnosis not present

## 2021-04-01 DIAGNOSIS — D1801 Hemangioma of skin and subcutaneous tissue: Secondary | ICD-10-CM | POA: Diagnosis not present

## 2021-04-15 DIAGNOSIS — C44722 Squamous cell carcinoma of skin of right lower limb, including hip: Secondary | ICD-10-CM | POA: Diagnosis not present

## 2021-04-20 ENCOUNTER — Ambulatory Visit (INDEPENDENT_AMBULATORY_CARE_PROVIDER_SITE_OTHER): Payer: BC Managed Care – PPO | Admitting: Legal Medicine

## 2021-04-20 ENCOUNTER — Other Ambulatory Visit: Payer: Self-pay

## 2021-04-20 ENCOUNTER — Encounter: Payer: Self-pay | Admitting: Legal Medicine

## 2021-04-20 VITALS — BP 120/74 | HR 74 | Temp 97.6°F | Resp 16 | Ht 63.0 in | Wt 171.0 lb

## 2021-04-20 DIAGNOSIS — H00015 Hordeolum externum left lower eyelid: Secondary | ICD-10-CM

## 2021-04-20 MED ORDER — CIPROFLOXACIN HCL 0.3 % OP SOLN: 2.0000 [drp] | OPHTHALMIC | 0 refills | Status: DC

## 2021-04-20 NOTE — Progress Notes (Signed)
Acute Office Visit  Subjective:    Patient ID: Julia Norman, female    DOB: [birth year], 70 y.o.   MRN: [id number]  Chief Complaint  Patient presents with   Eye Drainage   Fall    HPI: Patient is in today for right eye drainage in the mornings, itching and burning since 2 weeks. She also mentioned that she fell last weekend from treadmill and she hurt her head. She noticed a little bump on the right sided of the forehead. No fever or chills.  Past Medical History:  Diagnosis Date   Cancer [facility])    skin   Kidney stones     Past Surgical History:  Procedure Laterality Date   APPENDECTOMY  02/18/2019   CHOLECYSTECTOMY      Family History  Problem Relation Age of Onset   Cancer Mother        lymphoma   Depression Mother    Gallbladder disease Mother    Heart disease Father     Social History   Socioeconomic History   Marital status: Widowed    Spouse name: Not on file   Number of children: 2   Years of education: Not on file   Highest education level: Not on file  Occupational History   Occupation: customer service  Tobacco Use   Smoking status: Every Day    Packs/day: 0.30    Types: Cigarettes   Smokeless tobacco: Never  Substance and Sexual Activity   Alcohol use: Yes    Comment: ocassionally   Drug use: No   Sexual activity: Yes    Partners: Male  Other Topics Concern   Not on file  Social History Narrative   Not on file   Social Determinants of Health   Financial Resource Strain: Not on file  Food Insecurity: Not on file  Transportation Needs: Not on file  Physical Activity: Not on file  Stress: Not on file  Social Connections: Not on file  Intimate Partner Violence: Not on file    Outpatient Medications Prior to Visit  Medication Sig Dispense Refill   ciprofloxacin (CILOXAN) 0.3 % ophthalmic solution Place 2 drops into both eyes every 2 (two) hours. Administer 1 drop, every 2 hours, while awake, for 2 days. Then 1 drop, every 4 hours,  while awake, for the next 5 days. 5 mL 0   diazepam (VALIUM) 10 MG tablet SMARTSIG:Sublingual     sulfamethoxazole-trimethoprim (BACTRIM DS) 800-160 MG tablet Take 1 tablet by mouth 2 (two) times daily.     traMADol (ULTRAM) 50 MG tablet Take 50-100 mg by mouth 3 (three) times daily as needed.     No facility-administered medications prior to visit.    Allergies  Allergen Reactions   Codeine     Review of Systems  Constitutional:  Negative for chills, fatigue and fever.  HENT:  Negative for congestion, ear pain and sore throat.   Eyes:  Positive for discharge, redness and itching.  Respiratory:  Negative for cough and shortness of breath.   Cardiovascular:  Negative for chest pain and palpitations.  Gastrointestinal:  Negative for abdominal pain, constipation, diarrhea, nausea and vomiting.  Endocrine: Negative for polydipsia, polyphagia and polyuria.  Genitourinary:  Negative for difficulty urinating and dysuria.  Musculoskeletal:  Negative for arthralgias, back pain and myalgias.  Skin:  Negative for rash.  Neurological:  Positive for dizziness and headaches.  Psychiatric/Behavioral:  Negative for dysphoric mood. The patient is not nervous/anxious.  Objective:    Physical Exam  BP 120/74   Pulse 74   Temp 97.6 F (36.4 C)   Resp 16   Ht 5' 3"  (1.6 m)   Wt 171 lb (77.6 kg)   LMP 06/23/2005 (Approximate) Comment: 10 years ago   SpO2 96%   BMI 30.29 kg/m  Wt Readings from Last 3 Encounters:  04/20/21 171 lb (77.6 kg)  10/09/20 167 lb (75.8 kg)  06/26/20 165 lb (74.8 kg)    Health Maintenance Due  Topic Date Due   Hepatitis C Screening  Never done   Zoster Vaccines- Shingrix (1 of 2) Never done   TETANUS/TDAP  01/16/2019   COVID-19 Vaccine (3 - Moderna risk series) 09/18/2019   INFLUENZA VACCINE  01/19/2021   Pneumonia Vaccine 44+ Years old (2 - PPSV23 if available, else PCV20) 04/02/2021    There are no preventive care reminders to display for this  patient.   Lab Results  Component Value Date   TSH 2.200 04/02/2020   Lab Results  Component Value Date   WBC 8.2 10/09/2020   HGB 16.0 (H) 10/09/2020   HCT 47.3 (H) 10/09/2020   MCV 93 10/09/2020   PLT 277 10/09/2020   Lab Results  Component Value Date   NA 142 10/09/2020   K 5.0 10/09/2020   CO2 22 10/09/2020   GLUCOSE 87 10/09/2020   BUN 17 10/09/2020   CREATININE 0.72 10/09/2020   BILITOT 0.2 10/09/2020   ALKPHOS 64 10/09/2020   AST 18 10/09/2020   ALT 17 10/09/2020   PROT 6.7 10/09/2020   ALBUMIN 4.4 10/09/2020   CALCIUM 9.6 10/09/2020   EGFR 90 10/09/2020   GFR 92.22 11/05/2019   Lab Results  Component Value Date   CHOL 213 (H) 10/09/2020   Lab Results  Component Value Date   HDL 53 10/09/2020   Lab Results  Component Value Date   LDLCALC 139 (H) 10/09/2020   Lab Results  Component Value Date   TRIG 120 10/09/2020   Lab Results  Component Value Date   CHOLHDL 4.0 10/09/2020   No results found for: HGBA1C     Assessment & Plan:   Problem List Items Addressed This Visit   None Visit Diagnoses     Hordeolum externum of left lower eyelid    -  Primary   Relevant Medications   ciprofloxacin (CILOXAN) 0.3 % ophthalmic solution  Patient has stye , treat with warm compress and eye drops    Meds ordered this encounter  Medications   ciprofloxacin (CILOXAN) 0.3 % ophthalmic solution    Sig: Place 2 drops into both eyes every 2 (two) hours. Administer 1 drop, every 2 hours, while awake, for 2 days. Then 1 drop, every 4 hours, while awake, for the next 5 days.    Dispense:  5 mL    Refill:  0         Follow-up: Return if symptoms worsen or fail to improve.  An After Visit Summary was printed and given to the patient.  Reinaldo Meeker, MD [facility] [phone]

## 2021-04-29 DIAGNOSIS — C44622 Squamous cell carcinoma of skin of right upper limb, including shoulder: Secondary | ICD-10-CM | POA: Diagnosis not present

## 2021-05-28 ENCOUNTER — Encounter: Payer: Self-pay | Admitting: Legal Medicine

## 2021-05-28 ENCOUNTER — Other Ambulatory Visit: Payer: Self-pay

## 2021-05-28 ENCOUNTER — Ambulatory Visit (INDEPENDENT_AMBULATORY_CARE_PROVIDER_SITE_OTHER): Payer: Medicare Other | Admitting: Legal Medicine

## 2021-05-28 VITALS — BP 110/80 | HR 69 | Temp 98.1°F | Resp 16 | Ht 63.0 in | Wt 172.0 lb

## 2021-05-28 DIAGNOSIS — Z1331 Encounter for screening for depression: Secondary | ICD-10-CM

## 2021-05-28 DIAGNOSIS — Z0000 Encounter for general adult medical examination without abnormal findings: Secondary | ICD-10-CM | POA: Diagnosis not present

## 2021-05-28 DIAGNOSIS — N951 Menopausal and female climacteric states: Secondary | ICD-10-CM | POA: Diagnosis not present

## 2021-05-28 DIAGNOSIS — Z23 Encounter for immunization: Secondary | ICD-10-CM

## 2021-05-28 DIAGNOSIS — Z122 Encounter for screening for malignant neoplasm of respiratory organs: Secondary | ICD-10-CM

## 2021-05-28 NOTE — Progress Notes (Signed)
Subjective:   Julia Norman is a 70 y.o. female who presents for Medicare Annual (Subsequent) preventive examination.  Review of Systems    Review of Systems  Constitutional:  Negative for chills and fever.  HENT:  Positive for hearing loss.   Eyes:  Negative for redness.  Respiratory:  Negative for cough and hemoptysis.   Cardiovascular:  Negative for chest pain and palpitations.  Gastrointestinal:  Negative for melena.  Genitourinary:  Negative for dysuria.  Musculoskeletal:  Negative for myalgias and neck pain.  Skin:  Negative for itching and rash.  Neurological: Negative.   Endo/Heme/Allergies:  Negative for polydipsia.  Psychiatric/Behavioral: Negative.      Cardiac Risk Factors include: dyslipidemia;obesity (BMI >30kg/m2)     Objective:    Today's Vitals   05/28/21 1503 05/28/21 1514  BP: 110/80   Pulse: 69   Resp: 16   Temp: 98.1 F (36.7 C)   SpO2: 98%   Weight: 172 lb (78 kg)   Height: 5\' 3"  (1.6 m)   PainSc:  3    Body mass index is 30.47 kg/m.  Advanced Directives 05/28/2021  Does Patient Have a Medical Advance Directive? No  Gave information on it  Current Medications (verified) Outpatient Encounter Medications as of 05/28/2021  Medication Sig   [DISCONTINUED] ciprofloxacin (CILOXAN) 0.3 % ophthalmic solution Place 2 drops into both eyes every 2 (two) hours. Administer 1 drop, every 2 hours, while awake, for 2 days. Then 1 drop, every 4 hours, while awake, for the next 5 days.   No facility-administered encounter medications on file as of 05/28/2021.    Allergies (verified) Codeine   History: Past Medical History:  Diagnosis Date   Cancer ([place])    skin   Kidney stones    Past Surgical History:  Procedure Laterality Date   APPENDECTOMY  02/18/2019   CHOLECYSTECTOMY     Family History  Problem Relation Age of Onset   Cancer Mother        lymphoma   Depression Mother    Gallbladder disease Mother    Heart disease Father    Social  History   Socioeconomic History   Marital status: Widowed    Spouse name: Not on file   Number of children: 2   Years of education: Not on file   Highest education level: Not on file  Occupational History   Occupation: customer service  Tobacco Use   Smoking status: Every Day    Packs/day: 0.30    Types: Cigarettes   Smokeless tobacco: Never  Substance and Sexual Activity   Alcohol use: Yes    Comment: ocassionally   Drug use: No   Sexual activity: Yes    Partners: Male  Other Topics Concern   Not on file  Social History Narrative   Not on file   Social Determinants of Health   Financial Resource Strain: Not on file  Food Insecurity: No Food Insecurity   Worried About Running Out of Food in the Last Year: Never true   Ran Out of Food in the Last Year: Never true  Transportation Needs: No Transportation Needs   Lack of Transportation (Medical): No   Lack of Transportation (Non-Medical): No  Physical Activity: Insufficiently Active   Days of Exercise per Week: 3 days   Minutes of Exercise per Session: 30 min  Stress: Stress Concern Present   Feeling of Stress : To some extent  Social Connections: Moderately Isolated   Frequency of Communication with Friends  and Family: More than three times a week   Frequency of Social Gatherings with Friends and Family: More than three times a week   Attends Religious Services: More than 4 times per year   Active Member of Clubs or Organizations: No   Attends Archivist Meetings: Never   Marital Status: Widowed    Tobacco Counseling Ready to quit: No Counseling given: yes   Clinical Intake:  Pre-visit preparation completed: Yes  Pain : 0-10 Pain Score: 3  Pain Type: Acute pain Pain Location: Arm Pain Orientation: Right Pain Descriptors / Indicators: Aching Pain Onset: 1 to 4 weeks ago Pain Frequency: Several days a week Pain Relieving Factors: Ibuprofen Effect of Pain on Daily Activities: no  Pain  Relieving Factors: Ibuprofen  BMI - recorded: 30.47 Nutritional Risks: Unintentional weight loss  How often do you need to have someone help you when you read instructions, pamphlets, or other written materials from your doctor or pharmacy?: 1 - Never What is the last grade level you completed in school?: Some college  Diabetic?No  Interpreter Needed?: No      Activities of Daily Living In your present state of health, do you have any difficulty performing the following activities: 05/28/2021  Hearing? Y  Vision? Y  Difficulty concentrating or making decisions? N  Walking or climbing stairs? N  Dressing or bathing? N  Doing errands, shopping? N  Preparing Food and eating ? N  Using the Toilet? N  In the past six months, have you accidently leaked urine? Y  Comment sometimes  Do you have problems with loss of bowel control? N  Managing your Medications? N  Managing your Finances? N  Housekeeping or managing your Housekeeping? N  Some recent data might be hidden    Patient Care Team: Lillard Anes, MD as PCP - General (Family Medicine)  Indicate any recent Medical Services you may have received from other than [facility] providers in the past year (date may be approximate).     Assessment:   This is a routine wellness examination for Julia Norman.  Hearing/Vision screen No results found.  Dietary issues and exercise activities discussed: Current Exercise Habits: The patient does not participate in regular exercise at present, Exercise limited by: orthopedic condition(s)   Goals Addressed   None   Depression Screen PHQ 2/9 Scores 05/28/2021 04/02/2020 11/05/2019  PHQ - 2 Score 0 2 4  PHQ- 9 Score - 8 19    Fall Risk Fall Risk  05/28/2021 04/02/2020 04/02/2020  Falls in the past year? 1 0 0  Number falls in past yr: 0 0 0  Injury with Fall? 1 0 0  Risk for fall due to : Impaired mobility - -  Follow up Education provided Falls evaluation completed -    FALL RISK  PREVENTION PERTAINING TO THE HOME:  Any stairs in or around the home? No  If so, are there any without handrails? No  Home free of loose throw rugs in walkways, pet beds, electrical cords, etc? No  Adequate lighting in your home to reduce risk of falls? No   ASSISTIVE DEVICES UTILIZED TO PREVENT FALLS:  Life alert? No  Use of a cane, walker or w/c? No  Grab bars in the bathroom? No  Shower chair or bench in shower? No  Elevated toilet seat or a handicapped toilet? No   TIMED UP AND GO:  Was the test performed? Yes .  Length of time to ambulate 10 feet: 10 sec.  Gait steady and fast with assistive device  Cognitive Function:     6CIT Screen 05/28/2021  What Year? 0 points  What month? 0 points  What time? 0 points  Count back from 20 0 points  Months in reverse 0 points  Repeat phrase 0 points  Total Score 0    Immunizations Immunization History  Administered Date(s) Administered   Fluad Quad(high Dose 65+) 04/02/2020   Influenza Whole 05/06/2010   Influenza, High Dose Seasonal PF 05/11/2018   Moderna Sars-Covid-2 Vaccination 07/26/2019, 08/21/2019   Pneumococcal Conjugate-13 06/01/2017, 04/02/2020   Td 01/15/2009    TDAP status: Due, Education has been provided regarding the importance of this vaccine. Advised may receive this vaccine at local pharmacy or Health Dept. Aware to provide a copy of the vaccination record if obtained from local pharmacy or Health Dept. Verbalized acceptance and understanding.  Flu Vaccine status: Up to date  Pneumococcal vaccine status: Up to date  Covid-19 vaccine status: Information provided on how to obtain vaccines.   Qualifies for Shingles Vaccine? Yes   Zostavax completed Yes   Shingrix Completed?: No.    Education has been provided regarding the importance of this vaccine. Patient has been advised to call insurance company to determine out of pocket expense if they have not yet received this vaccine. Advised may also receive  vaccine at local pharmacy or Health Dept. Verbalized acceptance and understanding.  Screening Tests Health Maintenance  Topic Date Due   Hepatitis C Screening  Never done   Zoster Vaccines- Shingrix (1 of 2) Never done   TETANUS/TDAP  01/16/2019   COVID-19 Vaccine (3 - Moderna risk series) 09/18/2019   INFLUENZA VACCINE  01/19/2021   Pneumonia Vaccine 52+ Years old (2 - PPSV23 if available, else PCV20) 04/02/2021   MAMMOGRAM  04/21/2021   COLONOSCOPY (Pts 45-17yrs Insurance coverage will need to be confirmed)  02/11/2029   DEXA SCAN  Completed   HPV VACCINES  Aged Out    Health Maintenance  Health Maintenance Due  Topic Date Due   Hepatitis C Screening  Never done   Zoster Vaccines- Shingrix (1 of 2) Never done   TETANUS/TDAP  01/16/2019   COVID-19 Vaccine (3 - Moderna risk series) 09/18/2019   INFLUENZA VACCINE  01/19/2021   Pneumonia Vaccine 33+ Years old (2 - PPSV23 if available, else PCV20) 04/02/2021   MAMMOGRAM  04/21/2021    Colorectal cancer screening: Type of screening: Colonoscopy. Completed 2. Repeat every 8 years  Mammogram status: Ordered yes. Pt provided with contact info and advised to call to schedule appt.   Bone Density status: Ordered DXA. Pt provided with contact info and advised to call to schedule appt.  Lung Cancer Screening: (Low Dose CT Chest recommended if Age 69-80 years, 30 pack-year currently smoking OR have quit w/in 15years.) does qualify.   Lung Cancer Screening Referral: yes  Additional Screening:  Hepatitis C Screening: does not qualify; Completed no  Vision Screening: Recommended annual ophthalmology exams for early detection of glaucoma and other disorders of the eye. Is the patient up to date with their annual eye exam?  Yes  Who is the provider or what is the name of the office in which the patient attends annual eye exams? [facility] If pt is not established with a provider, would they like to be referred to a provider to establish  care? No .   Dental Screening: Recommended annual dental exams for proper oral hygiene  Community Resource Referral / Chronic Care Management:  CRR required this visit?  No   CCM required this visit?  No      Plan:     I have personally reviewed and noted the following in the patient's chart:   Medical and social history Use of alcohol, tobacco or illicit drugs  Current medications and supplements including opioid prescriptions.  Functional ability and status Nutritional status Physical activity Advanced directives List of other physicians Hospitalizations, surgeries, and ER visits in previous 12 months Vitals Screenings to include cognitive, depression, and falls Referrals and appointments  In addition, I have reviewed and discussed with patient certain preventive protocols, quality metrics, and best practice recommendations. A written personalized care plan for preventive services as well as general preventive health recommendations were provided to patient.     Augustin Coupe, CMA   05/28/2021   Nurse Notes:

## 2021-05-28 NOTE — Patient Instructions (Signed)

## 2021-06-08 DIAGNOSIS — N959 Unspecified menopausal and perimenopausal disorder: Secondary | ICD-10-CM | POA: Diagnosis not present

## 2021-06-08 DIAGNOSIS — M85852 Other specified disorders of bone density and structure, left thigh: Secondary | ICD-10-CM | POA: Diagnosis not present

## 2021-06-10 ENCOUNTER — Other Ambulatory Visit: Payer: Self-pay

## 2021-06-10 DIAGNOSIS — N951 Menopausal and female climacteric states: Secondary | ICD-10-CM

## 2021-06-16 ENCOUNTER — Ambulatory Visit
Admission: RE | Admit: 2021-06-16 | Discharge: 2021-06-16 | Disposition: A | Discharge status: Home / Self Care | Payer: BC Managed Care – PPO | Source: Ambulatory Visit | Attending: Legal Medicine | Admitting: Legal Medicine

## 2021-06-16 DIAGNOSIS — F17210 Nicotine dependence, cigarettes, uncomplicated: Secondary | ICD-10-CM | POA: Diagnosis not present

## 2021-06-16 DIAGNOSIS — Z87891 Personal history of nicotine dependence: Secondary | ICD-10-CM | POA: Diagnosis not present

## 2021-06-16 DIAGNOSIS — N951 Menopausal and female climacteric states: Secondary | ICD-10-CM

## 2021-06-16 DIAGNOSIS — Z1231 Encounter for screening mammogram for malignant neoplasm of breast: Secondary | ICD-10-CM | POA: Diagnosis not present

## 2021-06-17 ENCOUNTER — Other Ambulatory Visit: Payer: Self-pay

## 2021-06-17 DIAGNOSIS — Z122 Encounter for screening for malignant neoplasm of respiratory organs: Secondary | ICD-10-CM

## 2021-07-01 ENCOUNTER — Ambulatory Visit: Payer: Medicare Other | Admitting: Legal Medicine

## 2021-07-06 DIAGNOSIS — H5211 Myopia, right eye: Secondary | ICD-10-CM | POA: Diagnosis not present

## 2021-07-15 ENCOUNTER — Ambulatory Visit (INDEPENDENT_AMBULATORY_CARE_PROVIDER_SITE_OTHER): Payer: Medicare Other | Admitting: Legal Medicine

## 2021-07-15 ENCOUNTER — Other Ambulatory Visit: Payer: Self-pay

## 2021-07-15 ENCOUNTER — Encounter: Payer: Self-pay | Admitting: Legal Medicine

## 2021-07-15 VITALS — BP 100/64 | HR 74 | Temp 98.5°F | Resp 15 | Ht 63.0 in | Wt 174.0 lb

## 2021-07-15 DIAGNOSIS — R42 Dizziness and giddiness: Secondary | ICD-10-CM | POA: Diagnosis not present

## 2021-07-15 DIAGNOSIS — F17210 Nicotine dependence, cigarettes, uncomplicated: Secondary | ICD-10-CM | POA: Diagnosis not present

## 2021-07-15 DIAGNOSIS — Z6830 Body mass index (BMI) 30.0-30.9, adult: Secondary | ICD-10-CM

## 2021-07-15 DIAGNOSIS — Z23 Encounter for immunization: Secondary | ICD-10-CM

## 2021-07-15 DIAGNOSIS — E782 Mixed hyperlipidemia: Secondary | ICD-10-CM

## 2021-07-15 DIAGNOSIS — H9190 Unspecified hearing loss, unspecified ear: Secondary | ICD-10-CM

## 2021-07-15 MED ORDER — ZOSTER VAC RECOMB ADJUVANTED 50 MCG/0.5ML IM SUSR: 0.5000 mL | Freq: Once | INTRAMUSCULAR | 0 refills | Status: AC

## 2021-07-15 MED ORDER — PRAVASTATIN SODIUM 40 MG PO TABS: 40.0000 mg | ORAL_TABLET | Freq: Every day | ORAL | 3 refills | Status: DC

## 2021-07-15 NOTE — Progress Notes (Signed)
Subjective:  Patient ID: Julia Norman, female    DOB: 1951/03/08  Age: 71 y.o. MRN: [id number]  Chief Complaint  Patient presents with   Hyperlipidemia   Dizziness    HPI  Patient is eating healthy and she tries to stay active. She does not take any medication. However, she is feeling dizzy sometimes. No chest pain or sweats.  She requests EKG.  Patient mentioned aching pain on right pain on her arm and forearm. She uses her right arm for keyboarding  No current outpatient medications on file prior to visit.   No current facility-administered medications on file prior to visit.   Past Medical History:  Diagnosis Date   Cancer [facility])    skin   Kidney stones    Past Surgical History:  Procedure Laterality Date   APPENDECTOMY  02/18/2019   CHOLECYSTECTOMY      Family History  Problem Relation Age of Onset   Cancer Mother        lymphoma   Depression Mother    Gallbladder disease Mother    Heart disease Father    Breast cancer Maternal Aunt    Breast cancer Paternal Aunt    Social History   Socioeconomic History   Marital status: Widowed    Spouse name: Not on file   Number of children: 2   Years of education: Not on file   Highest education level: Not on file  Occupational History   Occupation: customer service  Tobacco Use   Smoking status: Every Day    Packs/day: 0.50    Types: Cigarettes   Smokeless tobacco: Never  Substance and Sexual Activity   Alcohol use: Yes    Comment: ocassionally   Drug use: No   Sexual activity: Yes    Partners: Male  Other Topics Concern   Not on file  Social History Narrative   Not on file   Social Determinants of Health   Financial Resource Strain: Low Risk    Difficulty of Paying Living Expenses: Not hard at all  Food Insecurity: No Food Insecurity   Worried About Charity fundraiser in the Last Year: Never true   [place] in the Last Year: Never true  Transportation Needs: No Transportation Needs   Lack  of Transportation (Medical): No   Lack of Transportation (Non-Medical): No  Physical Activity: Insufficiently Active   Days of Exercise per Week: 3 days   Minutes of Exercise per Session: 30 min  Stress: Stress Concern Present   Feeling of Stress : To some extent  Social Connections: Moderately Isolated   Frequency of Communication with Friends and Family: More than three times a week   Frequency of Social Gatherings with Friends and Family: More than three times a week   Attends Religious Services: More than 4 times per year   Active Member of [employer] or Organizations: No   Attends Archivist Meetings: Never   Marital Status: Widowed    Review of Systems  Constitutional:  Negative for chills, fatigue and fever.  HENT:  Negative for congestion, ear pain and sore throat.   Respiratory:  Negative for cough and shortness of breath.   Cardiovascular:  Negative for chest pain and palpitations.  Gastrointestinal:  Negative for abdominal pain, constipation, diarrhea, nausea and vomiting.  Endocrine: Negative for polydipsia, polyphagia and polyuria.  Genitourinary:  Negative for difficulty urinating and dysuria.  Musculoskeletal:  Positive for arthralgias, back pain and myalgias.  Skin:  Negative for rash.  Neurological:  Positive for dizziness. Negative for headaches.  Psychiatric/Behavioral:  Negative for dysphoric mood. The patient is not nervous/anxious.     Objective:  BP 100/64    Pulse 74    Temp 98.5 F (36.9 C)    Resp 15    Ht 5\' 3"  (1.6 m)    Wt 174 lb (78.9 kg)    LMP 06/23/2005 (Approximate) Comment: 10 years ago    SpO2 97%    BMI 30.82 kg/m   BP/Weight 07/15/2021 05/28/2021 46/27/0350  Systolic BP 093 818 299  Diastolic BP 64 80 74  Wt. (Lbs) 174 172 171  BMI 30.82 30.47 30.29    Physical Exam Vitals reviewed.  Constitutional:      General: She is in acute distress.     Appearance: Normal appearance. She is obese.  HENT:     Head: Normocephalic.     Right  Ear: Tympanic membrane, ear canal and external ear normal.     Left Ear: Tympanic membrane, ear canal and external ear normal.     Nose: Nose normal.     Mouth/Throat:     Mouth: Mucous membranes are moist.     Pharynx: Oropharynx is clear. No posterior oropharyngeal erythema.  Eyes:     Extraocular Movements: Extraocular movements intact.     Conjunctiva/sclera: Conjunctivae normal.     Pupils: Pupils are equal, round, and reactive to light.  Cardiovascular:     Rate and Rhythm: Normal rate and regular rhythm.     Pulses: Normal pulses.     Heart sounds: Normal heart sounds. No murmur heard. Pulmonary:     Effort: Pulmonary effort is normal. No respiratory distress.     Breath sounds: Normal breath sounds. No wheezing.  Abdominal:     General: Bowel sounds are normal. There is no distension.     Palpations: Abdomen is soft. There is no mass.     Tenderness: There is no abdominal tenderness.  Musculoskeletal:        General: Normal range of motion.     Cervical back: Normal range of motion. No tenderness.     Right lower leg: No edema.     Left lower leg: No edema.  Skin:    General: Skin is warm.     Capillary Refill: Capillary refill takes less than 2 seconds.  Neurological:     General: No focal deficit present.     Mental Status: She is alert and oriented to person, place, and time. Mental status is at baseline.     Gait: Gait normal.     Deep Tendon Reflexes: Reflexes normal.  Psychiatric:        Mood and Affect: Mood normal.        Behavior: Behavior normal.        Thought Content: Thought content normal.        Lab Results  Component Value Date   WBC 8.2 10/09/2020   HGB 16.0 (H) 10/09/2020   HCT 47.3 (H) 10/09/2020   PLT 277 10/09/2020   GLUCOSE 87 10/09/2020   CHOL 213 (H) 10/09/2020   TRIG 120 10/09/2020   HDL 53 10/09/2020   LDLDIRECT 128.2 05/27/2014   LDLCALC 139 (H) 10/09/2020   ALT 17 10/09/2020   AST 18 10/09/2020   NA 142 10/09/2020   K 5.0  10/09/2020   CL 104 10/09/2020   CREATININE 0.72 10/09/2020   BUN 17 10/09/2020   CO2 22 10/09/2020  TSH 2.200 04/02/2020      Assessment & Plan:   Problem List Items Addressed This Visit       Nervous and Auditory  Dizzinesses      Relevant Orders  CBC with Differential/Platelet Patient notes he gets dizzy when she gets up in the morning occasionally gets dizzy standing up quickly.  She denies any chest pain or shortness of breath no palpitations.  EKG was normal.     Hearing loss Patient does have hearing loss has hearing aids.     Other   Hyperlipidemia - Primary   Relevant Orders   Comprehensive metabolic panel   Lipid panel   TSH AN INDIVIDUAL CARE PLAN for hyperlipidemia/ cholesterol was established and reinforced today.  The patient's status was assessed using clinical findings on exam, lab and other diagnostic tests. The patient's disease status was assessed based on evidence-based guidelines and found to be fair controlled. MEDICATIONS were reviewed. Statin was started SELF MANAGEMENT GOALS have been discussed and patient's success at attaining the goal of low cholesterol was assessed. RECOMMENDATION given include regular exercise 3 days a week and low cholesterol/low fat diet. CLINICAL SUMMARY including written plan to identify barriers unique to the patient due to social or economic  reasons was discussed.     Nicotine dependence We discussed quitting cigarette smoking and congratulated.    BMI 30.0-30.9,adult An individualize plan was formulated for obesity using patient history and physical exam to encourage weight loss.  An evidence based program was formulated.  Patient is to cut portion size with meals and to plan physical exercise 3 days a week at least 20 minutes.  Weight watchers and other programs are helpful.  Planned amount of weight loss 10 lbs.    Other Visit Diagnoses              Need for vaccination       Relevant Medications   Zoster  Vaccine Adjuvanted [facility]) injection     .  Meds ordered this encounter  Medications   Zoster Vaccine Adjuvanted [facility]) injection    Sig: Inject 0.5 mLs into the muscle once for 1 dose.    Dispense:  0.5 mL    Refill:  0    Orders Placed This Encounter  Procedures   Comprehensive metabolic panel   Lipid panel   CBC with Differential/Platelet   TSH    I,Marla I Leal-Borjas,acting as a scribe for Reinaldo Meeker, MD.,have documented all relevant documentation on the behalf of Reinaldo Meeker, MD,as directed by  Reinaldo Meeker, MD while in the presence of Reinaldo Meeker, MD.   Follow-up: No follow-ups on file.  An After Visit Summary was printed and given to the patient.  Reinaldo Meeker, MD [facility] [phone]

## 2021-07-16 LAB — CBC WITH DIFFERENTIAL/PLATELET
Basophils Absolute: 0.1 10*3/uL (ref 0.0–0.2)
Basos: 1 %
EOS (ABSOLUTE): 0.1 10*3/uL (ref 0.0–0.4)
Eos: 2 %
Hematocrit: 46.5 % (ref 34.0–46.6)
Hemoglobin: 15.8 g/dL (ref 11.1–15.9)
Immature Grans (Abs): 0 10*3/uL (ref 0.0–0.1)
Immature Granulocytes: 0 %
Lymphocytes Absolute: 1.6 10*3/uL (ref 0.7–3.1)
Lymphs: 20 %
MCH: 31.2 pg (ref 26.6–33.0)
MCHC: 34 g/dL (ref 31.5–35.7)
MCV: 92 fL (ref 79–97)
Monocytes Absolute: 0.6 10*3/uL (ref 0.1–0.9)
Monocytes: 7 %
Neutrophils Absolute: 5.7 10*3/uL (ref 1.4–7.0)
Neutrophils: 70 %
Platelets: 288 10*3/uL (ref 150–450)
RBC: 5.06 x10E6/uL (ref 3.77–5.28)
RDW: 12.8 % (ref 11.7–15.4)
WBC: 8.1 10*3/uL (ref 3.4–10.8)

## 2021-07-16 LAB — COMPREHENSIVE METABOLIC PANEL
ALT: 16 IU/L (ref 0–32)
AST: 16 IU/L (ref 0–40)
Albumin/Globulin Ratio: 1.8 (ref 1.2–2.2)
Albumin: 4.2 g/dL (ref 3.8–4.8)
Alkaline Phosphatase: 66 IU/L (ref 44–121)
BUN/Creatinine Ratio: 12 (ref 12–28)
BUN: 9 mg/dL (ref 8–27)
Bilirubin Total: 0.4 mg/dL (ref 0.0–1.2)
CO2: 23 mmol/L (ref 20–29)
Calcium: 9.3 mg/dL (ref 8.7–10.3)
Chloride: 105 mmol/L (ref 96–106)
Creatinine, Ser: 0.77 mg/dL (ref 0.57–1.00)
Globulin, Total: 2.4 g/dL (ref 1.5–4.5)
Glucose: 95 mg/dL (ref 70–99)
Potassium: 4.2 mmol/L (ref 3.5–5.2)
Sodium: 141 mmol/L (ref 134–144)
Total Protein: 6.6 g/dL (ref 6.0–8.5)
eGFR: 83 mL/min/{1.73_m2} (ref >59)

## 2021-07-16 LAB — LIPID PANEL
Chol/HDL Ratio: 4.6 ratio — ABNORMAL HIGH (ref 0.0–4.4)
Cholesterol, Total: 210 mg/dL — ABNORMAL HIGH (ref 100–199)
HDL: 46 mg/dL (ref >39)
LDL Chol Calc (NIH): 130 mg/dL — ABNORMAL HIGH (ref 0–99)
Triglycerides: 190 mg/dL — ABNORMAL HIGH (ref 0–149)
VLDL Cholesterol Cal: 34 mg/dL (ref 5–40)

## 2021-07-16 LAB — CARDIOVASCULAR RISK ASSESSMENT

## 2021-07-16 LAB — TSH: TSH: 1.68 u[IU]/mL (ref 0.450–4.500)

## 2021-07-16 NOTE — Progress Notes (Signed)
Triglycerides high 190, LDL cholesterol high 130 use pravastatin, kidney and liver tests normal, CBC normal, TSH 1.68 normal lp

## 2021-07-20 NOTE — Addendum Note (Signed)
Addended by: Thompson Caul I on: 07/20/2021 01:19 PM   Modules accepted: Orders

## 2021-07-27 ENCOUNTER — Telehealth: Payer: Self-pay

## 2021-07-27 ENCOUNTER — Ambulatory Visit: Payer: BC Managed Care – PPO | Admitting: Legal Medicine

## 2021-07-27 NOTE — Telephone Encounter (Signed)
Set up an appointment to be see today.

## 2021-07-27 NOTE — Telephone Encounter (Signed)
Continue pravastatin these should resolve. May need to be checked for UTI lp

## 2021-07-27 NOTE — Telephone Encounter (Signed)
Patient started pravastatin 1/25. Three days ago began having dry mouth and urinary frequency. Denies burning, abd pain, and/or back pain. She read these could be side effects of statin. Please advise.   Royce Macadamia, [place] 07/27/21 11:01 AM

## 2021-07-28 ENCOUNTER — Encounter: Payer: Self-pay | Admitting: Legal Medicine

## 2021-07-28 ENCOUNTER — Other Ambulatory Visit: Payer: Self-pay

## 2021-07-28 ENCOUNTER — Ambulatory Visit (INDEPENDENT_AMBULATORY_CARE_PROVIDER_SITE_OTHER): Payer: Medicare Other | Admitting: Legal Medicine

## 2021-07-28 VITALS — BP 120/70 | HR 73 | Temp 98.4°F | Resp 15 | Ht 63.0 in | Wt 173.0 lb

## 2021-07-28 DIAGNOSIS — R350 Frequency of micturition: Secondary | ICD-10-CM | POA: Diagnosis not present

## 2021-07-28 DIAGNOSIS — N200 Calculus of kidney: Secondary | ICD-10-CM | POA: Insufficient documentation

## 2021-07-28 DIAGNOSIS — R1030 Lower abdominal pain, unspecified: Secondary | ICD-10-CM | POA: Diagnosis not present

## 2021-07-28 DIAGNOSIS — D259 Leiomyoma of uterus, unspecified: Secondary | ICD-10-CM | POA: Diagnosis not present

## 2021-07-28 DIAGNOSIS — R319 Hematuria, unspecified: Secondary | ICD-10-CM | POA: Diagnosis not present

## 2021-07-28 LAB — POCT URINALYSIS DIP (CLINITEK)
Bilirubin, UA: NEGATIVE
Glucose, UA: NEGATIVE mg/dL
Ketones, POC UA: NEGATIVE mg/dL
Leukocytes, UA: NEGATIVE
Nitrite, UA: NEGATIVE
Spec Grav, UA: >=1.03 — AB (ref 1.010–1.025)
Urobilinogen, UA: 0.2 E.U./dL
pH, UA: 5.5 (ref 5.0–8.0)

## 2021-07-28 NOTE — Addendum Note (Signed)
Addended by: Reinaldo Meeker on: 07/28/2021 04:42 PM   Modules accepted: Level of Service

## 2021-07-28 NOTE — Progress Notes (Signed)
Acute Office Visit  Subjective:    Patient ID: Julia Norman, female    DOB: 05-Nov-1950, 71 y.o.   MRN: [id number]  Chief Complaint  Patient presents with   Urinary Frequency    HPI: Patient is in today for frequency urination, and back pain that is coming and going. She also feels dry skin and mouth feels dry since she started pravastatin. She has history of kidney stones.  She is presently having some right flank pain but is not severe she does have 2+ hematuria on urinalysis.  We will get an ultrasound of the abdomen.  Past Medical History:  Diagnosis Date   Cancer [facility])    skin   Kidney stones     Past Surgical History:  Procedure Laterality Date   APPENDECTOMY  02/18/2019   CHOLECYSTECTOMY      Family History  Problem Relation Age of Onset   Cancer Mother        lymphoma   Depression Mother    Gallbladder disease Mother    Heart disease Father    Breast cancer Maternal Aunt    Breast cancer Paternal Aunt     Social History   Socioeconomic History   Marital status: Widowed    Spouse name: Not on file   Number of children: 2   Years of education: Not on file   Highest education level: Not on file  Occupational History   Occupation: customer service  Tobacco Use   Smoking status: Every Day    Packs/day: 0.50    Types: Cigarettes   Smokeless tobacco: Never  Substance and Sexual Activity   Alcohol use: Yes    Comment: ocassionally   Drug use: No   Sexual activity: Yes    Partners: Male  Other Topics Concern   Not on file  Social History Narrative   Not on file   Social Determinants of Health   Financial Resource Strain: Low Risk    Difficulty of Paying Living Expenses: Not hard at all  Food Insecurity: No Food Insecurity   Worried About Charity fundraiser in the Last Year: Never true   [place] in the Last Year: Never true  Transportation Needs: No Transportation Needs   Lack of Transportation (Medical): No   Lack of Transportation  (Non-Medical): No  Physical Activity: Insufficiently Active   Days of Exercise per Week: 3 days   Minutes of Exercise per Session: 30 min  Stress: Stress Concern Present   Feeling of Stress : To some extent  Social Connections: Moderately Isolated   Frequency of Communication with Friends and Family: More than three times a week   Frequency of Social Gatherings with Friends and Family: More than three times a week   Attends Religious Services: More than 4 times per year   Active Member of [employer] or Organizations: No   Attends Archivist Meetings: Never   Marital Status: Widowed  Human resources officer Violence: Not At Risk   Fear of Current or Ex-Partner: No   Emotionally Abused: No   Physically Abused: No   Sexually Abused: No    Outpatient Medications Prior to Visit  Medication Sig Dispense Refill   pravastatin (PRAVACHOL) 40 MG tablet Take 1 tablet (40 mg total) by mouth daily. 90 tablet 3   No facility-administered medications prior to visit.    Allergies  Allergen Reactions   Codeine     Review of Systems  Constitutional:  Negative for chills, fatigue  and fever.  HENT:  Negative for congestion, ear pain and sore throat.   Respiratory:  Negative for cough and shortness of breath.   Cardiovascular:  Negative for chest pain and palpitations.  Gastrointestinal:  Positive for abdominal pain (right flank). Negative for constipation, diarrhea, nausea and vomiting.  Endocrine: Negative for polydipsia, polyphagia and polyuria.  Genitourinary:  Positive for frequency and pelvic pain. Negative for difficulty urinating and dysuria.  Musculoskeletal:  Positive for back pain. Negative for arthralgias and myalgias.  Skin:  Negative for rash.  Neurological:  Negative for headaches.  Psychiatric/Behavioral:  Negative for dysphoric mood. The patient is not nervous/anxious.       Objective:    Physical Exam Vitals reviewed.  Constitutional:      General: She is in acute  distress.     Appearance: Normal appearance. She is obese.  HENT:     Head: Normocephalic.     Right Ear: Tympanic membrane, ear canal and external ear normal.     Left Ear: Tympanic membrane, ear canal and external ear normal.     Nose: Nose normal.     Mouth/Throat:     Mouth: Mucous membranes are moist.     Pharynx: Oropharynx is clear. No posterior oropharyngeal erythema.  Eyes:     Extraocular Movements: Extraocular movements intact.     Conjunctiva/sclera: Conjunctivae normal.     Pupils: Pupils are equal, round, and reactive to light.  Neck:     Vascular: No carotid bruit.  Cardiovascular:     Rate and Rhythm: Normal rate and regular rhythm.     Pulses: Normal pulses.     Heart sounds: Normal heart sounds. No murmur heard.   No gallop.  Pulmonary:     Effort: Pulmonary effort is normal. No respiratory distress.     Breath sounds: Normal breath sounds. No wheezing.  Abdominal:     General: Bowel sounds are normal.     Palpations: Abdomen is soft. There is no mass.     Tenderness: There is abdominal tenderness (both lower quadrants).  Musculoskeletal:        General: Normal range of motion.     Cervical back: Normal range of motion. No tenderness.     Right lower leg: No edema.     Left lower leg: No edema.  Skin:    General: Skin is warm.     Capillary Refill: Capillary refill takes less than 2 seconds.  Neurological:     General: No focal deficit present.     Mental Status: She is alert and oriented to person, place, and time. Mental status is at baseline.     Gait: Gait normal.     Deep Tendon Reflexes: Reflexes normal.  Psychiatric:        Mood and Affect: Mood normal.        Behavior: Behavior normal.        Thought Content: Thought content normal.    BP 120/70    Pulse 73    Temp 98.4 F (36.9 C)    Resp 15    Ht 5' 3"  (1.6 m)    Wt 173 lb (78.5 kg)    LMP 06/23/2005 (Approximate) Comment: 10 years ago    SpO2 98%    BMI 30.65 kg/m  Wt Readings from Last 3  Encounters:  07/28/21 173 lb (78.5 kg)  07/15/21 174 lb (78.9 kg)  05/28/21 172 lb (78 kg)    Health Maintenance Due  Topic Date Due  TETANUS/TDAP  01/16/2019   COVID-19 Vaccine (3 - Moderna risk series) 09/18/2019   Pneumonia Vaccine 12+ Years old (2 - PPSV23 if available, else PCV20) 04/02/2021    There are no preventive care reminders to display for this patient.   Lab Results  Component Value Date   TSH 1.680 07/15/2021   Lab Results  Component Value Date   WBC 8.1 07/15/2021   HGB 15.8 07/15/2021   HCT 46.5 07/15/2021   MCV 92 07/15/2021   PLT 288 07/15/2021   Lab Results  Component Value Date   NA 141 07/15/2021   K 4.2 07/15/2021   CO2 23 07/15/2021   GLUCOSE 95 07/15/2021   BUN 9 07/15/2021   CREATININE 0.77 07/15/2021   BILITOT 0.4 07/15/2021   ALKPHOS 66 07/15/2021   AST 16 07/15/2021   ALT 16 07/15/2021   PROT 6.6 07/15/2021   ALBUMIN 4.2 07/15/2021   CALCIUM 9.3 07/15/2021   EGFR 83 07/15/2021   GFR 92.22 11/05/2019   Lab Results  Component Value Date   CHOL 210 (H) 07/15/2021   Lab Results  Component Value Date   HDL 46 07/15/2021   Lab Results  Component Value Date   LDLCALC 130 (H) 07/15/2021   Lab Results  Component Value Date   TRIG 190 (H) 07/15/2021   Lab Results  Component Value Date   CHOLHDL 4.6 (H) 07/15/2021   No results found for: HGBA1C     Assessment & Plan:       Here for Suggest problem List Items Addressed This Visit       Genitourinary   Renal calculus   Relevant Orders   Urine Culture Patient has probable renal calculus on the right side we will get an ultrasound on her today   Other Visit Diagnoses     Frequency of urination    -  Primary   Relevant Orders   POCT URINALYSIS DIP (CLINITEK) Patient is having urinary frequency after starting pravastatin she has 2+ blood in her urine therefore we will that this is a side effect of the pravastatin   Lower abdominal pain       Relevant Orders   US  Abdomen Complete Patient has lower abdominal pain especially on the right side on examination suspect is related to her renal calculus but we will do an ultrasound today.        Orders Placed This Encounter  Procedures   Urine Culture   US Abdomen Complete   POCT URINALYSIS DIP (CLINITEK)     Follow-up: Return for renal stone.  An After Visit Summary was printed and given to the patient.  Reinaldo Meeker, MD [facility] [phone]

## 2021-07-29 ENCOUNTER — Other Ambulatory Visit: Payer: Self-pay

## 2021-07-29 DIAGNOSIS — R1030 Lower abdominal pain, unspecified: Secondary | ICD-10-CM

## 2021-07-31 LAB — URINE CULTURE

## 2021-07-31 NOTE — Progress Notes (Signed)
Urine culture negative p

## 2021-11-18 DIAGNOSIS — C44729 Squamous cell carcinoma of skin of left lower limb, including hip: Secondary | ICD-10-CM | POA: Diagnosis not present

## 2021-11-24 DIAGNOSIS — N23 Unspecified renal colic: Secondary | ICD-10-CM | POA: Diagnosis not present

## 2021-11-24 DIAGNOSIS — N202 Calculus of kidney with calculus of ureter: Secondary | ICD-10-CM | POA: Diagnosis not present

## 2021-12-17 DIAGNOSIS — C44729 Squamous cell carcinoma of skin of left lower limb, including hip: Secondary | ICD-10-CM | POA: Diagnosis not present

## 2022-02-25 ENCOUNTER — Telehealth: Payer: Self-pay | Admitting: *Deleted

## 2022-02-25 NOTE — Patient Outreach (Signed)
  Care Coordination   Initial Visit Note   02/25/2022 Name: Julia Norman MRN: [id number] DOB: 05/26/1951  Julia Norman is a 71 y.o. year old female who sees Lillard Anes, MD for primary care. I spoke with  [name] by phone today.  What matters to the patients health and wellness today?  Better management of my health care needs.    Goals Addressed   None     SDOH assessments and interventions completed:  No     Care Coordination Interventions Activated:  No  Care Coordination Interventions:  No, not indicated   Follow up plan: Follow up call scheduled for 03/04/22 with RNCM    Encounter Outcome:  Pt. Scheduled

## 2022-03-04 ENCOUNTER — Ambulatory Visit: Payer: Self-pay

## 2022-03-04 NOTE — Patient Outreach (Signed)
  Care Coordination   Initial Visit Note   03/04/2022 Name: Julia Norman MRN: [id number] DOB: 02-08-1951  Julia Norman is a 71 y.o. year old female who sees Lillard Anes, MD for primary care. I spoke with  [name] by phone today.  What matters to the patients health and wellness today?  Spoke with patient who is in a work meeting and can not talk right now. Appointment rescheduled.    SDOH assessments and interventions completed:  No     Care Coordination Interventions Activated:  No  Care Coordination Interventions:  No, not indicated   Follow up plan: Follow up call scheduled for 03/08/2022  at 1130    Encounter Outcome:  Pt. Visit Completed   Tomasa Rand, RN, BSN, CEN [place] Coordinator [phone]

## 2022-03-05 ENCOUNTER — Encounter: Payer: Self-pay | Admitting: Family Medicine

## 2022-03-05 ENCOUNTER — Ambulatory Visit (INDEPENDENT_AMBULATORY_CARE_PROVIDER_SITE_OTHER): Payer: BC Managed Care – PPO | Admitting: Family Medicine

## 2022-03-05 VITALS — BP 110/64 | HR 72 | Temp 96.3°F | Resp 16 | Ht 63.0 in | Wt 172.4 lb

## 2022-03-05 DIAGNOSIS — R42 Dizziness and giddiness: Secondary | ICD-10-CM

## 2022-03-05 DIAGNOSIS — R3129 Other microscopic hematuria: Secondary | ICD-10-CM

## 2022-03-05 DIAGNOSIS — R350 Frequency of micturition: Secondary | ICD-10-CM | POA: Diagnosis not present

## 2022-03-05 LAB — POCT URINALYSIS DIPSTICK
Bilirubin, UA: NEGATIVE
Glucose, UA: NEGATIVE
Ketones, UA: NEGATIVE
Leukocytes, UA: NEGATIVE
Nitrite, UA: NEGATIVE
Protein, UA: NEGATIVE
Spec Grav, UA: 1.02 (ref 1.010–1.025)
Urobilinogen, UA: 0.2 E.U./dL
pH, UA: 5.5 (ref 5.0–8.0)

## 2022-03-05 NOTE — Patient Instructions (Signed)
Recommend otc dramamine if dizziness worsens Check labs.  Refer to urology.

## 2022-03-05 NOTE — Progress Notes (Signed)
Acute Office Visit  Subjective:    Patient ID: Julia Norman, female    DOB: 05-25-1951, 71 y.o.   MRN: [id number]  Chief Complaint  Patient presents with   Dizziness    HPI: Patient is in today for Dizziness: described as off balance. Going on for a few days.   Past Medical History:  Diagnosis Date   Cancer [facility])    skin   Kidney stones     Past Surgical History:  Procedure Laterality Date   APPENDECTOMY  02/18/2019   CHOLECYSTECTOMY      Family History  Problem Relation Age of Onset   Cancer Mother        lymphoma   Depression Mother    Gallbladder disease Mother    Heart disease Father    Breast cancer Maternal Aunt    Breast cancer Paternal Aunt     Social History   Socioeconomic History   Marital status: Widowed    Spouse name: Not on file   Number of children: 2   Years of education: Not on file   Highest education level: Not on file  Occupational History   Occupation: customer service  Tobacco Use   Smoking status: Every Day    Packs/day: 0.50    Types: Cigarettes   Smokeless tobacco: Never  Substance and Sexual Activity   Alcohol use: Yes    Comment: ocassionally   Drug use: No   Sexual activity: Yes    Partners: Male  Other Topics Concern   Not on file  Social History Narrative   Not on file   Social Determinants of Health   Financial Resource Strain: Low Risk  (05/28/2021)   Overall Financial Resource Strain (CARDIA)    Difficulty of Paying Living Expenses: Not hard at all  Food Insecurity: No Food Insecurity (03/10/2022)   Hunger Vital Sign    Worried About Running Out of Food in the Last Year: Never true    [place] in the Last Year: Never true  Transportation Needs: No Transportation Needs (03/10/2022)   PRAPARE - Hydrologist (Medical): No    Lack of Transportation (Non-Medical): No  Physical Activity: Insufficiently Active (05/28/2021)   Exercise Vital Sign    Days of Exercise per Week: 3  days    Minutes of Exercise per Session: 30 min  Stress: Stress Concern Present (05/28/2021)   [place]    Feeling of Stress : To some extent  Social Connections: Moderately Isolated (05/28/2021)   Social Connection and Isolation Panel [NHANES]    Frequency of Communication with Friends and Family: More than three times a week    Frequency of Social Gatherings with Friends and Family: More than three times a week    Attends Religious Services: More than 4 times per year    Active Member of [employer] or Organizations: No    Attends Archivist Meetings: Never    Marital Status: Widowed  Intimate Partner Violence: Not At Risk (03/10/2022)   Humiliation, Afraid, Rape, and Kick questionnaire    Fear of Current or Ex-Partner: No    Emotionally Abused: No    Physically Abused: No    Sexually Abused: No    Outpatient Medications Prior to Visit  Medication Sig Dispense Refill   pravastatin (PRAVACHOL) 40 MG tablet Take 1 tablet (40 mg total) by mouth daily. 90 tablet 3   No facility-administered medications  prior to visit.    Allergies  Allergen Reactions   Codeine     Review of Systems  Constitutional:  Negative for chills, fatigue and fever.  HENT:  Negative for congestion, rhinorrhea, sinus pressure, sinus pain and sore throat.   Respiratory:  Negative for cough and shortness of breath.   Cardiovascular:  Negative for chest pain.  Gastrointestinal:  Positive for diarrhea (loose stool in am and then it is ok.). Negative for abdominal pain, constipation, nausea and vomiting.  Genitourinary:  Positive for frequency. Negative for dysuria and urgency.       Urine leakage   Musculoskeletal:  Negative for back pain and myalgias.  Neurological:  Positive for dizziness. Negative for weakness, light-headedness and headaches.       Balance issues  Psychiatric/Behavioral:  Negative for dysphoric mood. The patient is not  nervous/anxious.        Objective:    Physical Exam Vitals reviewed.  Constitutional:      Appearance: Normal appearance. She is normal weight.  Neck:     Vascular: No carotid bruit.  Cardiovascular:     Rate and Rhythm: Normal rate and regular rhythm.     Heart sounds: Normal heart sounds.  Pulmonary:     Effort: Pulmonary effort is normal. No respiratory distress.     Breath sounds: Normal breath sounds.  Abdominal:     General: Abdomen is flat. Bowel sounds are normal.     Palpations: Abdomen is soft.     Tenderness: There is no abdominal tenderness.  Neurological:     Mental Status: She is alert and oriented to person, place, and time.  Psychiatric:        Mood and Affect: Mood normal.        Behavior: Behavior normal.   BP 110/64   Pulse 72   Temp (!) 96.3 F (35.7 C)   Resp 16   Ht 5' 3"  (1.6 m)   Wt 172 lb 6.4 oz (78.2 kg)   LMP 06/23/2005 (Approximate) Comment: 10 years ago   BMI 30.54 kg/m  Wt Readings from Last 3 Encounters:  03/05/22 172 lb 6.4 oz (78.2 kg)  07/28/21 173 lb (78.5 kg)  07/15/21 174 lb (78.9 kg)   No data found.   Health Maintenance Due  Topic Date Due   TETANUS/TDAP  01/16/2019   COVID-19 Vaccine (3 - Moderna risk series) 09/18/2019   Pneumonia Vaccine 23+ Years old (2 - PPSV23 or PCV20) 05/28/2020   Zoster Vaccines- Shingrix (2 of 2) 09/09/2021   INFLUENZA VACCINE  01/19/2022    There are no preventive care reminders to display for this patient.   Lab Results  Component Value Date   TSH 1.170 03/05/2022   Lab Results  Component Value Date   WBC 8.0 03/05/2022   HGB 15.8 03/05/2022   HCT 46.1 03/05/2022   MCV 94 03/05/2022   PLT 323 03/05/2022   Lab Results  Component Value Date   NA 140 03/05/2022   K 4.4 03/05/2022   CO2 21 03/05/2022   GLUCOSE 96 03/05/2022   BUN 10 03/05/2022   CREATININE 0.71 03/05/2022   BILITOT 0.4 03/05/2022   ALKPHOS 70 03/05/2022   AST 19 03/05/2022   ALT 14 03/05/2022   PROT 6.8  03/05/2022   ALBUMIN 4.2 03/05/2022   CALCIUM 9.9 03/05/2022   EGFR 91 03/05/2022   GFR 92.22 11/05/2019   Lab Results  Component Value Date   CHOL 210 (H) 07/15/2021  Lab Results  Component Value Date   HDL 46 07/15/2021   Lab Results  Component Value Date   LDLCALC 130 (H) 07/15/2021   Lab Results  Component Value Date   TRIG 190 (H) 07/15/2021   Lab Results  Component Value Date   CHOLHDL 4.6 (H) 07/15/2021   No results found for: "HGBA1C"     Assessment & Plan:   Problem List Items Addressed This Visit       Genitourinary   Other microscopic hematuria    UA and Urine culture Refer to urology.      Relevant Orders   Ambulatory referral to Urology     Other   Urinary frequency - Primary    UA and Urine culture Refer to urology.      Relevant Orders   Urine Culture (Completed)   POCT urinalysis dipstick (Completed)   Dizziness    Recommend otc dramamine if dizziness worsens Check labs.        Relevant Orders   CBC with Differential/Platelet (Completed)   Comprehensive metabolic panel (Completed)   TSH (Completed)   No orders of the defined types were placed in this encounter.   Orders Placed This Encounter  Procedures   Urine Culture   CBC with Differential/Platelet   Comprehensive metabolic panel   TSH   Ambulatory referral to Urology   POCT urinalysis dipstick     Follow-up: Return for awv with Maudie Mercury or one of [place]. .  An After Visit Summary was printed and given to the patient.  Rochel Brome, MD [facility] [phone]

## 2022-03-06 LAB — CBC WITH DIFFERENTIAL/PLATELET
Basophils Absolute: 0.1 10*3/uL (ref 0.0–0.2)
Basos: 1 %
EOS (ABSOLUTE): 0.1 10*3/uL (ref 0.0–0.4)
Eos: 1 %
Hematocrit: 46.1 % (ref 34.0–46.6)
Hemoglobin: 15.8 g/dL (ref 11.1–15.9)
Immature Grans (Abs): 0 10*3/uL (ref 0.0–0.1)
Immature Granulocytes: 0 %
Lymphocytes Absolute: 1.7 10*3/uL (ref 0.7–3.1)
Lymphs: 22 %
MCH: 32.3 pg (ref 26.6–33.0)
MCHC: 34.3 g/dL (ref 31.5–35.7)
MCV: 94 fL (ref 79–97)
Monocytes Absolute: 0.6 10*3/uL (ref 0.1–0.9)
Monocytes: 8 %
Neutrophils Absolute: 5.5 10*3/uL (ref 1.4–7.0)
Neutrophils: 68 %
Platelets: 323 10*3/uL (ref 150–450)
RBC: 4.89 x10E6/uL (ref 3.77–5.28)
RDW: 12.8 % (ref 11.7–15.4)
WBC: 8 10*3/uL (ref 3.4–10.8)

## 2022-03-06 LAB — COMPREHENSIVE METABOLIC PANEL
ALT: 14 IU/L (ref 0–32)
AST: 19 IU/L (ref 0–40)
Albumin/Globulin Ratio: 1.6 (ref 1.2–2.2)
Albumin: 4.2 g/dL (ref 3.9–4.9)
Alkaline Phosphatase: 70 IU/L (ref 44–121)
BUN/Creatinine Ratio: 14 (ref 12–28)
BUN: 10 mg/dL (ref 8–27)
Bilirubin Total: 0.4 mg/dL (ref 0.0–1.2)
CO2: 21 mmol/L (ref 20–29)
Calcium: 9.9 mg/dL (ref 8.7–10.3)
Chloride: 104 mmol/L (ref 96–106)
Creatinine, Ser: 0.71 mg/dL (ref 0.57–1.00)
Globulin, Total: 2.6 g/dL (ref 1.5–4.5)
Glucose: 96 mg/dL (ref 70–99)
Potassium: 4.4 mmol/L (ref 3.5–5.2)
Sodium: 140 mmol/L (ref 134–144)
Total Protein: 6.8 g/dL (ref 6.0–8.5)
eGFR: 91 mL/min/{1.73_m2} (ref >59)

## 2022-03-06 LAB — TSH: TSH: 1.17 u[IU]/mL (ref 0.450–4.500)

## 2022-03-07 NOTE — Progress Notes (Signed)
Blood count normal.  Liver function normal.  Kidney function normal.  Thyroid function normal.

## 2022-03-08 ENCOUNTER — Ambulatory Visit: Payer: Self-pay

## 2022-03-08 LAB — URINE CULTURE

## 2022-03-08 NOTE — Patient Outreach (Signed)
  Care Coordination   Initial Visit Note   03/08/2022 Name: Julia Norman MRN: [id number] DOB: 28-Feb-1951  Julia Norman is a 71 y.o. year old female who sees Lillard Anes, MD for primary care. I spoke with  [name] by phone today.  What matters to the patients health and wellness today?  Placed call to patient for scheduled appointment. Reports she had an emergency that she had to handle and can not do call right now. Rescheduled for 03/10/2022      SDOH assessments and interventions completed:  No     Care Coordination Interventions Activated:  Yes  Care Coordination Interventions:  Yes, provided   Follow up plan: Follow up call scheduled for 03/10/2022    Encounter Outcome:  Pt. Scheduled   Tomasa Rand, RN, BSN, [place] Coordinator [phone]

## 2022-03-10 ENCOUNTER — Ambulatory Visit: Payer: Self-pay

## 2022-03-10 NOTE — Patient Outreach (Signed)
  Care Coordination   Initial Visit Note   03/10/2022 Name: Julia Norman MRN: [id number] DOB: 11/16/1950  Julia Norman is a 71 y.o. year old female who sees Lillard Anes, MD for primary care. I spoke with  [name] by phone today.  What matters to the patients health and wellness today?  " Worried about return of skin cancer"     Goals Addressed               This Visit's Progress     Concerns for skin (pt-stated)        Care Coordination Interventions: Evaluation of current treatment plan related to skin concerns and patient's adherence to plan as established by provider Reviewed with patient her concerns for return of skin lesion and need for follow up with dermatologist. Reviewed importance of wearing sun screen. Reviewed importance of annual full skin assessment Encouraged patient to keep appointment with Dermatologist on 03/17/2022          SDOH assessments and interventions completed:  Yes  SDOH Interventions Today    Flowsheet Row Most Recent Value  SDOH Interventions   Food Insecurity Interventions Intervention Not Indicated  Housing Interventions Intervention Not Indicated  Transportation Interventions Intervention Not Indicated  Utilities Interventions Intervention Not Indicated        Care Coordination Interventions Activated:  Yes  Care Coordination Interventions:  Yes, provided   Follow up plan: Follow up call scheduled for 04/21/2022    Encounter Outcome:  Pt. Visit Completed   Tomasa Rand, RN, BSN, CEN [place] Coordinator [phone]

## 2022-03-14 DIAGNOSIS — R350 Frequency of micturition: Secondary | ICD-10-CM

## 2022-03-14 DIAGNOSIS — R42 Dizziness and giddiness: Secondary | ICD-10-CM | POA: Insufficient documentation

## 2022-03-14 DIAGNOSIS — R3129 Other microscopic hematuria: Secondary | ICD-10-CM | POA: Insufficient documentation

## 2022-03-14 HISTORY — DX: Frequency of micturition: R35.0

## 2022-03-14 NOTE — Assessment & Plan Note (Signed)
Recommend otc dramamine if dizziness worsens Check labs.

## 2022-03-14 NOTE — Assessment & Plan Note (Signed)
UA and Urine culture Refer to urology.

## 2022-03-16 DIAGNOSIS — L821 Other seborrheic keratosis: Secondary | ICD-10-CM | POA: Diagnosis not present

## 2022-03-16 DIAGNOSIS — D485 Neoplasm of uncertain behavior of skin: Secondary | ICD-10-CM | POA: Diagnosis not present

## 2022-03-24 DIAGNOSIS — Z87442 Personal history of urinary calculi: Secondary | ICD-10-CM | POA: Diagnosis not present

## 2022-03-24 DIAGNOSIS — Z79899 Other long term (current) drug therapy: Secondary | ICD-10-CM | POA: Diagnosis not present

## 2022-03-24 DIAGNOSIS — N3281 Overactive bladder: Secondary | ICD-10-CM | POA: Diagnosis not present

## 2022-03-24 DIAGNOSIS — R3129 Other microscopic hematuria: Secondary | ICD-10-CM | POA: Diagnosis not present

## 2022-03-24 DIAGNOSIS — N952 Postmenopausal atrophic vaginitis: Secondary | ICD-10-CM | POA: Diagnosis not present

## 2022-04-08 ENCOUNTER — Other Ambulatory Visit: Payer: Self-pay | Admitting: Family Medicine

## 2022-04-08 DIAGNOSIS — R3129 Other microscopic hematuria: Secondary | ICD-10-CM

## 2022-04-20 DIAGNOSIS — R3129 Other microscopic hematuria: Secondary | ICD-10-CM | POA: Diagnosis not present

## 2022-04-20 DIAGNOSIS — N952 Postmenopausal atrophic vaginitis: Secondary | ICD-10-CM | POA: Diagnosis not present

## 2022-04-21 ENCOUNTER — Ambulatory Visit: Payer: Self-pay

## 2022-04-21 DIAGNOSIS — C44729 Squamous cell carcinoma of skin of left lower limb, including hip: Secondary | ICD-10-CM | POA: Diagnosis not present

## 2022-04-21 NOTE — Patient Outreach (Signed)
  Care Coordination   Follow Up Visit Note   04/21/2022 Name: Julia Norman MRN: [id number] DOB: [birth year]  Julia Norman is a 71 y.o. year old female who sees Lillard Anes, MD for primary care. I spoke with  [name] by phone today.  What matters to the patients health and wellness today?  "I have just left MD office have a skin cancer removed"    Goals Addressed               This Visit's Progress     Concerns for skin (pt-stated)        Care Coordination Interventions: Patient states she just left MD office have a skin cancer removed from her leg.  Reports she has her discharge care notes and knows how to take care of this wound. Encouraged patient to call me if needed. Other follow up scheduled for 1 month.          SDOH assessments and interventions completed:  No     Care Coordination Interventions Activated:  Yes  Care Coordination Interventions:  Yes, provided   Follow up plan: Follow up call scheduled for 05/26/2022    Encounter Outcome:  Pt. Visit Completed   Tomasa Rand, RN, BSN, CEN [place] Coordinator [phone]

## 2022-04-29 ENCOUNTER — Telehealth (INDEPENDENT_AMBULATORY_CARE_PROVIDER_SITE_OTHER): Payer: Medicare Other | Admitting: Nurse Practitioner

## 2022-04-29 ENCOUNTER — Encounter: Payer: Self-pay | Admitting: Nurse Practitioner

## 2022-04-29 DIAGNOSIS — U071 COVID-19: Secondary | ICD-10-CM

## 2022-04-29 MED ORDER — PROMETHAZINE-DM 6.25-15 MG/5ML PO SYRP: 5.0000 mL | ORAL_SOLUTION | Freq: Four times a day (QID) | ORAL | 0 refills | Status: DC | PRN

## 2022-04-29 MED ORDER — MOLNUPIRAVIR EUA 200MG CAPSULE: 4.0000 | ORAL_CAPSULE | Freq: Two times a day (BID) | ORAL | 0 refills | Status: AC

## 2022-04-29 NOTE — Progress Notes (Signed)
Virtual Visit via Telephone Note   This visit type was conducted due to national recommendations for restrictions regarding the COVID-19 Pandemic (e.g. social distancing) in an effort to limit this patient's exposure and mitigate transmission in our community.  Due to her co-morbid illnesses, this patient is at least at moderate risk for complications without adequate follow up.  This format is felt to be most appropriate for this patient at this time.  The patient did not have access to video technology/had technical difficulties with video requiring transitioning to audio format only (telephone).  All issues noted in this document were discussed and addressed.  No physical exam could be performed with this format.  Patient verbally consented to a telehealth visit.   Date:  04/29/2022   ID:  Julia Norman, DOB 1950-07-31, MRN [id number]  Patient Location: Home Provider Location: Office/Clinic  PCP:  Lillard Anes, MD   Evaluation Performed:  Established patient, acute telemedicine visit  Chief Complaint:  COVID-19  History of Present Illness:    Julia Norman is a 71 y.o. female with Upper respiratory symptoms She complains of sinus congestion, rhinorrhea, low-grade fever, and non-productive cough. Onset of symptoms was  2 days ago and staying constant.Treatment has included drinking plenty of fluids and taking Ibuprofen  Past history is significant for occasional episodes of bronchitis. Patient is smoker.Positive home COVID-19 test today. Last eGFR (03/05/22) 91  The patient does have symptoms concerning for COVID-19 infection (fever, chills, cough, or new shortness of breath).    Past Medical History:  Diagnosis Date   Cancer [facility])    skin   Kidney stones     Past Surgical History:  Procedure Laterality Date   APPENDECTOMY  02/18/2019   CHOLECYSTECTOMY      Family History  Problem Relation Age of Onset   Cancer Mother        lymphoma   Depression Mother     Gallbladder disease Mother    Heart disease Father    Breast cancer Maternal Aunt    Breast cancer Paternal Aunt     Social History   Socioeconomic History   Marital status: Widowed    Spouse name: Not on file   Number of children: 2   Years of education: Not on file   Highest education level: Not on file  Occupational History   Occupation: customer service  Tobacco Use   Smoking status: Every Day    Packs/day: 0.50    Types: Cigarettes   Smokeless tobacco: Never  Substance and Sexual Activity   Alcohol use: Yes    Comment: ocassionally   Drug use: No   Sexual activity: Yes    Partners: Male  Other Topics Concern   Not on file  Social History Narrative   Not on file   Social Determinants of Health   Financial Resource Strain: Low Risk  (05/28/2021)   Overall Financial Resource Strain (CARDIA)    Difficulty of Paying Living Expenses: Not hard at all  Food Insecurity: No Food Insecurity (03/10/2022)   Hunger Vital Sign    Worried About Running Out of Food in the Last Year: Never true    [place] in the Last Year: Never true  Transportation Needs: No Transportation Needs (03/10/2022)   PRAPARE - Hydrologist (Medical): No    Lack of Transportation (Non-Medical): No  Physical Activity: Insufficiently Active (05/28/2021)   Exercise Vital Sign    Days of Exercise  per Week: 3 days    Minutes of Exercise per Session: 30 min  Stress: Stress Concern Present (05/28/2021)   [place]    Feeling of Stress : To some extent  Social Connections: Moderately Isolated (05/28/2021)   Social Connection and Isolation Panel [NHANES]    Frequency of Communication with Friends and Family: More than three times a week    Frequency of Social Gatherings with Friends and Family: More than three times a week    Attends Religious Services: More than 4 times per year    Active Member of [employer] or  Organizations: No    Attends Archivist Meetings: Never    Marital Status: Widowed  Intimate Partner Violence: Not At Risk (03/10/2022)   Humiliation, Afraid, Rape, and Kick questionnaire    Fear of Current or Ex-Partner: No    Emotionally Abused: No    Physically Abused: No    Sexually Abused: No    No outpatient medications prior to visit.   No facility-administered medications prior to visit.    Allergies:   Codeine   Social History   Tobacco Use   Smoking status: Every Day    Packs/day: 0.50    Types: Cigarettes   Smokeless tobacco: Never  Substance Use Topics   Alcohol use: Yes    Comment: ocassionally   Drug use: No     ROS  See pertinent positives and negatives per HPI.  Labs/Other Tests and Data Reviewed:    Recent Labs: 03/05/2022: ALT 14; BUN 10; Creatinine, Ser 0.71; Hemoglobin 15.8; Platelets 323; Potassium 4.4; Sodium 140; TSH 1.170   Recent Lipid Panel Lab Results  Component Value Date/Time   CHOL 210 (H) 07/15/2021 09:57 AM   TRIG 190 (H) 07/15/2021 09:57 AM   HDL 46 07/15/2021 09:57 AM   CHOLHDL 4.6 (H) 07/15/2021 09:57 AM   CHOLHDL 4 08/07/2018 08:31 AM   LDLCALC 130 (H) 07/15/2021 09:57 AM   LDLDIRECT 128.2 05/27/2014 09:12 AM    Wt Readings from Last 3 Encounters:  03/05/22 172 lb 6.4 oz (78.2 kg)  07/28/21 173 lb (78.5 kg)  07/15/21 174 lb (78.9 kg)     Objective:    Vital Signs:  LMP 06/23/2005 (Approximate) Comment: 10 years ago    Physical Exam No physical exam due to telemedicine visit  ASSESSMENT & PLAN:     1. Positive self-administered antigen test for COVID-19 - molnupiravir EUA (LAGEVRIO) 200 mg CAPS capsule; Take 4 capsules (800 mg total) by mouth 2 (two) times daily for 5 days.  Dispense: 40 capsule; Refill: 0 - promethazine-dextromethorphan (PROMETHAZINE-DM) 6.25-15 MG/5ML syrup; Take 5 mLs by mouth 4 (four) times daily as needed for cough.  Dispense: 118 mL; Refill: 0    Rest and push fluids Take Ibuprofen  as directed for fever, body aches Take Molnupiravir as directed Take Promethazine-DM up to 4 times daily as needed for cough Notify office if symptoms worsen or fail to improve    COVID-19 Education: The signs and symptoms of COVID-19 were discussed with the patient and how to seek care for testing (follow up with PCP or arrange E-visit). The importance of social distancing was discussed today.  I, Rip Harbour, NP, have reviewed all documentation for this visit. The documentation on 04/29/22 for the exam, diagnosis, procedures, and orders are all accurate and complete.    I spent 10 minutes dedicated to the care of this patient on the date of  this encounter to include face-to-face time with the patient, as well as: EMR review and prescription medication management.  Follow Up:  In Person prn  Signed,  Jerrell Belfast, DNP  04/29/2022 10:31 AM    [place]

## 2022-05-24 ENCOUNTER — Inpatient Hospital Stay: Admission: RE | Admit: 2022-05-24 | Payer: BC Managed Care – PPO | Source: Ambulatory Visit

## 2022-05-24 DIAGNOSIS — R1030 Lower abdominal pain, unspecified: Secondary | ICD-10-CM | POA: Diagnosis not present

## 2022-05-24 DIAGNOSIS — R319 Hematuria, unspecified: Secondary | ICD-10-CM | POA: Diagnosis not present

## 2022-05-24 DIAGNOSIS — I700 Atherosclerosis of aorta: Secondary | ICD-10-CM | POA: Diagnosis not present

## 2022-05-26 ENCOUNTER — Ambulatory Visit: Payer: Self-pay

## 2022-05-26 NOTE — Patient Outreach (Signed)
  Care Coordination   Follow Up Visit Note   05/26/2022 Name: Julia Norman MRN: [id number] DOB: 07-Jun-1951  Julia Norman is a 71 y.o. year old female who sees Julia Anes, MD for primary care. I spoke with  Julia Norman by phone today.  What matters to the patients health and wellness today?  Patient reports to me that her skin cancer site has healed. Reports she has been having blood in her urine and had a CT 2 days ago and is awaiting result. Denies any additional concerns. Reports she has healed from Julia Norman.  Denies any additional needs today.    Goals Addressed               This Visit's Progress     COMPLETED: Concerns for skin (pt-stated)        Care Coordination Interventions: Goal met          SDOH assessments and interventions completed:  No     Care Coordination Interventions:  Yes, provided   Follow up plan: No further intervention required.   Encounter Outcome:  Pt. Visit Completed   .acs

## 2022-06-03 ENCOUNTER — Encounter: Payer: BC Managed Care – PPO | Admitting: Family Medicine

## 2022-06-10 DIAGNOSIS — N3281 Overactive bladder: Secondary | ICD-10-CM | POA: Diagnosis not present

## 2022-06-10 DIAGNOSIS — R3129 Other microscopic hematuria: Secondary | ICD-10-CM | POA: Diagnosis not present

## 2022-06-10 DIAGNOSIS — N952 Postmenopausal atrophic vaginitis: Secondary | ICD-10-CM | POA: Diagnosis not present

## 2022-06-10 DIAGNOSIS — N200 Calculus of kidney: Secondary | ICD-10-CM | POA: Diagnosis not present

## 2022-06-11 DIAGNOSIS — L570 Actinic keratosis: Secondary | ICD-10-CM | POA: Diagnosis not present

## 2022-06-11 DIAGNOSIS — D485 Neoplasm of uncertain behavior of skin: Secondary | ICD-10-CM | POA: Diagnosis not present

## 2022-06-11 DIAGNOSIS — L578 Other skin changes due to chronic exposure to nonionizing radiation: Secondary | ICD-10-CM | POA: Diagnosis not present

## 2022-06-11 DIAGNOSIS — L821 Other seborrheic keratosis: Secondary | ICD-10-CM | POA: Diagnosis not present

## 2022-06-11 DIAGNOSIS — D225 Melanocytic nevi of trunk: Secondary | ICD-10-CM | POA: Diagnosis not present

## 2022-06-11 DIAGNOSIS — L814 Other melanin hyperpigmentation: Secondary | ICD-10-CM | POA: Diagnosis not present

## 2022-06-30 ENCOUNTER — Ambulatory Visit (INDEPENDENT_AMBULATORY_CARE_PROVIDER_SITE_OTHER): Payer: Medicare Other | Admitting: Nurse Practitioner

## 2022-06-30 ENCOUNTER — Encounter: Payer: Self-pay | Admitting: Nurse Practitioner

## 2022-06-30 VITALS — BP 124/72 | HR 72 | Temp 98.0°F | Resp 16 | Ht 63.0 in | Wt 173.6 lb

## 2022-06-30 DIAGNOSIS — Z1231 Encounter for screening mammogram for malignant neoplasm of breast: Secondary | ICD-10-CM | POA: Diagnosis not present

## 2022-06-30 DIAGNOSIS — Z23 Encounter for immunization: Secondary | ICD-10-CM | POA: Diagnosis not present

## 2022-06-30 DIAGNOSIS — Z0000 Encounter for general adult medical examination without abnormal findings: Secondary | ICD-10-CM

## 2022-06-30 NOTE — Patient Instructions (Addendum)
Follow up in 1-2 weeks about current health conditions and medicines  Health Maintenance, Female Adopting a healthy lifestyle and getting preventive care are important in promoting health and wellness. Ask your health care provider about: The right schedule for you to have regular tests and exams. Things you can do on your own to prevent diseases and keep yourself healthy. What should I know about diet, weight, and exercise? Eat a healthy diet  Eat a diet that includes plenty of vegetables, fruits, low-fat dairy products, and lean protein. Do not eat a lot of foods that are high in solid fats, added sugars, or sodium. Maintain a healthy weight Body mass index (BMI) is used to identify weight problems. It estimates body fat based on height and weight. Your health care provider can help determine your BMI and help you achieve or maintain a healthy weight. Get regular exercise Get regular exercise. This is one of the most important things you can do for your health. Most adults should: Exercise for at least 150 minutes each week. The exercise should increase your heart rate and make you sweat (moderate-intensity exercise). Do strengthening exercises at least twice a week. This is in addition to the moderate-intensity exercise. Spend less time sitting. Even light physical activity can be beneficial. Watch cholesterol and blood lipids Have your blood tested for lipids and cholesterol at 71 years of age, then have this test every 5 years. Have your cholesterol levels checked more often if: Your lipid or cholesterol levels are high. You are older than 71 years of age. You are at high risk for heart disease. What should I know about cancer screening? Depending on your health history and family history, you may need to have cancer screening at various ages. This may include screening for: Breast cancer. Cervical cancer. Colorectal cancer. Skin cancer. Lung cancer. What should I know about  heart disease, diabetes, and high blood pressure? Blood pressure and heart disease High blood pressure causes heart disease and increases the risk of stroke. This is more likely to develop in people who have high blood pressure readings or are overweight. Have your blood pressure checked: Every 3-5 years if you are 25-100 years of age. Every year if you are 25 years old or older. Diabetes Have regular diabetes screenings. This checks your fasting blood sugar level. Have the screening done: Once every three years after age 66 if you are at a normal weight and have a low risk for diabetes. More often and at a younger age if you are overweight or have a high risk for diabetes. What should I know about preventing infection? Hepatitis B If you have a higher risk for hepatitis B, you should be screened for this virus. Talk with your health care provider to find out if you are at risk for hepatitis B infection. Hepatitis C Testing is recommended for: Everyone born from [birth year] through 1964. Anyone with known risk factors for hepatitis C. Sexually transmitted infections (STIs) Get screened for STIs, including gonorrhea and chlamydia, if: You are sexually active and are younger than 71 years of age. You are older than 71 years of age and your health care provider tells you that you are at risk for this type of infection. Your sexual activity has changed since you were last screened, and you are at increased risk for chlamydia or gonorrhea. Ask your health care provider if you are at risk. Ask your health care provider about whether you are at high risk for HIV. Your  health care provider may recommend a prescription medicine to help prevent HIV infection. If you choose to take medicine to prevent HIV, you should first get tested for HIV. You should then be tested every 3 months for as long as you are taking the medicine. Pregnancy If you are about to stop having your period (premenopausal) and you may  become pregnant, seek counseling before you get pregnant. Take 400 to 800 micrograms (mcg) of folic acid every day if you become pregnant. Ask for birth control (contraception) if you want to prevent pregnancy. Osteoporosis and menopause Osteoporosis is a disease in which the bones lose minerals and strength with aging. This can result in bone fractures. If you are 27 years old or older, or if you are at risk for osteoporosis and fractures, ask your health care provider if you should: Be screened for bone loss. Take a calcium or vitamin D supplement to lower your risk of fractures. Be given hormone replacement therapy (HRT) to treat symptoms of menopause. Follow these instructions at home: Alcohol use Do not drink alcohol if: Your health care provider tells you not to drink. You are pregnant, may be pregnant, or are planning to become pregnant. If you drink alcohol: Limit how much you have to: 0-1 drink a day. Know how much alcohol is in your drink. In the [place] one drink equals one 12 oz bottle of beer (355 mL), one 5 oz glass of wine (148 mL), or one 1 oz glass of hard liquor (44 mL). Lifestyle Do not use any products that contain nicotine or tobacco. These products include cigarettes, chewing tobacco, and vaping devices, such as e-cigarettes. If you need help quitting, ask your health care provider. Do not use street drugs. Do not share needles. Ask your health care provider for help if you need support or information about quitting drugs. General instructions Schedule regular health, dental, and eye exams. Stay current with your vaccines. Tell your health care provider if: You often feel depressed. You have ever been abused or do not feel safe at home. Summary Adopting a healthy lifestyle and getting preventive care are important in promoting health and wellness. Follow your health care provider's instructions about healthy diet, exercising, and getting tested or screened for  diseases. Follow your health care provider's instructions on monitoring your cholesterol and blood pressure. This information is not intended to replace advice given to you by your health care provider. Make sure you discuss any questions you have with your health care provider. Document Revised: 10/27/2020 Document Reviewed: 10/27/2020 Elsevier Patient Education  [place].

## 2022-06-30 NOTE — Progress Notes (Addendum)
Subjective:   Julia Norman is a 72 y.o. female who presents for Medicare Annual (Subsequent) preventive examination.  Review of Systems    Review of Systems  Constitutional:  Negative for chills, fever and malaise/fatigue.  HENT:  Positive for hearing loss (hearing aids).   Eyes:  Negative for blurred vision and double vision.  Respiratory:  Negative for cough and hemoptysis.   Cardiovascular:  Negative for chest pain and palpitations.  Gastrointestinal:  Negative for constipation and diarrhea.  Genitourinary:  Positive for urgency (flomax).  Musculoskeletal:  Positive for back pain (occasional).  Skin:  Negative for itching and rash.  Neurological:  Negative for dizziness, weakness and headaches.  Psychiatric/Behavioral:  Negative for depression, memory loss and suicidal ideas. The patient does not have insomnia.     Cardiac Risk Factors include: advanced age (>109mn, >>35women);dyslipidemia;smoking/ tobacco exposure;sedentary lifestyle;family history of premature cardiovascular disease;obesity (BMI >30kg/m2)     Objective:    Today's Vitals   06/30/22 1157 06/30/22 1210  BP: 124/72   Pulse: 72   Resp: 16   Temp: 98 F (36.7 C)   Weight: 173 lb 9.6 oz (78.7 kg)   Height: '5\' 3"'$  (1.6 m)   PainSc:  4    Body mass index is 30.75 kg/m.     06/30/2022   11:58 AM 05/28/2021    3:07 PM  Advanced Directives  Does Patient Have a Medical Advance Directive? No No  Would patient like information on creating a medical advance directive? Yes (Inpatient - patient requests chaplain consult to create a medical advance directive)     Current Medications (verified) Outpatient Encounter Medications as of 06/30/2022  Medication Sig   tamsulosin (FLOMAX) 0.4 MG CAPS capsule SMARTSIG:1 Capsule(s) By Mouth Every Evening (Patient not taking: Reported on 06/30/2022)   [DISCONTINUED] promethazine-dextromethorphan (PROMETHAZINE-DM) 6.25-15 MG/5ML syrup Take 5 mLs by mouth 4 (four) times daily  as needed for cough.   No facility-administered encounter medications on file as of 06/30/2022.    Allergies (verified) Codeine   History: Past Medical History:  Diagnosis Date   Cancer ([place]    skin   Kidney stones    Past Surgical History:  Procedure Laterality Date   APPENDECTOMY  02/18/2019   CHOLECYSTECTOMY     Family History  Problem Relation Age of Onset   Cancer Mother        lymphoma   Depression Mother    Gallbladder disease Mother    Heart disease Father    Breast cancer Maternal Aunt    Breast cancer Paternal Aunt    Social History   Socioeconomic History   Marital status: Widowed    Spouse name: Not on file   Number of children: 2   Years of education: Not on file   Highest education level: Not on file  Occupational History   Occupation: customer service  Tobacco Use   Smoking status: Every Day    Packs/day: 0.50    Types: Cigarettes   Smokeless tobacco: Never  Substance and Sexual Activity   Alcohol use: Yes    Comment: ocassionally   Drug use: No   Sexual activity: Yes    Partners: Male  Other Topics Concern   Not on file  Social History Narrative   Not on file   Social Determinants of Health   Financial Resource Strain: Low Risk  (06/30/2022)   Overall Financial Resource Strain (CARDIA)    Difficulty of Paying Living Expenses: Not hard at all  Food Insecurity: No Food Insecurity (06/30/2022)   Hunger Vital Sign    Worried About Running Out of Food in the Last Year: Never true    Ran Out of Food in the Last Year: Never true  Transportation Needs: No Transportation Needs (06/30/2022)   PRAPARE - Hydrologist (Medical): No    Lack of Transportation (Non-Medical): No  Physical Activity: Sufficiently Active (06/30/2022)   Exercise Vital Sign    Days of Exercise per Week: 7 days    Minutes of Exercise per Session: 40 min  Stress: No Stress Concern Present (06/30/2022)   [place]    Feeling of Stress : Only a little  Social Connections: Moderately Isolated (06/30/2022)   Social Connection and Isolation Panel [NHANES]    Frequency of Communication with Friends and Family: More than three times a week    Frequency of Social Gatherings with Friends and Family: Once a week    Attends Religious Services: More than 4 times per year    Active Member of [employer] or Organizations: No    Attends Archivist Meetings: Never    Marital Status: Widowed    Tobacco Counseling Ready to quit: Not Answered Counseling given: Not Answered   Clinical Intake:  Pre-visit preparation completed: Yes  Pain : 0-10 Pain Score: 4  Pain Type: Chronic pain Pain Location: Abdomen Pain Orientation: Posterior Pain Radiating Towards: lower back and left leg Pain Descriptors / Indicators: Aching Pain Onset: More than a month ago Pain Frequency: Intermittent Pain Relieving Factors: ibuprofen Effect of Pain on Daily Activities: only riding in the car  Pain Relieving Factors: ibuprofen  BMI - recorded: 30.75 Nutritional Status: BMI > 30  Obese Nutritional Risks: None Diabetes: No  How often do you need to have someone help you when you read instructions, pamphlets, or other written materials from your doctor or pharmacy?: 1 - Never What is the last grade level you completed in school?: some college  Diabetic?No  Interpreter Needed?: No      Activities of Daily Living    06/30/2022   12:02 PM  In your present state of health, do you have any difficulty performing the following activities:  Hearing? 1  Comment Hearing aid bilteral since 2015  Vision? 0  Comment wearing glasses to use computer  Difficulty concentrating or making decisions? 0  Walking or climbing stairs? 0  Dressing or bathing? 0  Doing errands, shopping? 0  Preparing Food and eating ? N  Using the Toilet? N  In the past six months, have you accidently leaked urine?  Y  Comment wearing  adults pull up  Do you have problems with loss of bowel control? N  Managing your Medications? N  Managing your Finances? N  Housekeeping or managing your Housekeeping? N    Patient Care Team: Lillard Anes, MD (Inactive) as PCP - General (Family Medicine)  Indicate any recent Medical Services you may have received from other than [facility] providers in the past year (date may be approximate).     Assessment:   This is a routine wellness examination for Julia Norman.  Hearing/Vision screen Vision Screening   Right eye Left eye Both eyes  Without correction '20/25 20/30 20/20 '$  With correction       Dietary issues and exercise activities discussed: Current Exercise Habits: The patient does not participate in regular exercise at present, Exercise limited by: None identified  Goals Addressed   None    Depression Screen    06/30/2022   12:00 PM 03/10/2022   11:41 AM 05/28/2021    3:08 PM 04/02/2020    4:36 PM 11/05/2019    2:41 PM  PHQ 2/9 Scores  PHQ - 2 Score 0 0 0 2 4  PHQ- 9 Score    8 19    Fall Risk    06/30/2022   11:58 AM 03/10/2022   11:45 AM 05/28/2021    3:08 PM 04/02/2020    3:15 PM 04/02/2020    2:47 PM  [place] in the past year? 0 0 1 0 0  Number falls in past yr: 0 0 0 0 0  Injury with Fall? 0 0 1 0 0  Risk for fall due to : No Fall Risks  Impaired mobility    Follow up Education provided;Falls prevention discussed  Education provided Falls evaluation completed     FALL RISK PREVENTION PERTAINING TO THE HOME:  Any stairs in or around the home? Yes  If so, are there any without handrails? Yes  Home free of loose throw rugs in walkways, pet beds, electrical cords, etc? Yes  Adequate lighting in your home to reduce risk of falls? Yes   ASSISTIVE DEVICES UTILIZED TO PREVENT FALLS:  Life alert? No  Use of a cane, walker or w/c? No  Grab bars in the bathroom? No  Shower chair or bench in shower? No  Elevated toilet seat  or a handicapped toilet? No   TIMED UP AND GO:  Was the test performed? Yes .  Length of time to ambulate 10 feet: 2 sec.   Gait steady and walked without assisted device  Cognitive Function:        06/30/2022   12:07 PM 05/28/2021    3:12 PM  6CIT Screen  What Year? 0 points 0 points  What month? 0 points 0 points  What time? 0 points 0 points  Count back from 20 0 points 0 points  Months in reverse 0 points 0 points  Repeat phrase 0 points 0 points  Total Score 0 points 0 points    Immunizations Immunization History  Administered Date(s) Administered   Fluad Quad(high Dose 65+) 04/02/2020, 05/28/2021, 06/30/2022   Influenza Whole 05/06/2010   Influenza, High Dose Seasonal PF 05/11/2018   Moderna Sars-Covid-2 Vaccination 07/26/2019, 08/21/2019   Pneumococcal Conjugate-13 06/01/2017, 04/02/2020   Td 01/15/2009   Zoster Recombinat (Shingrix) 07/15/2021    TDAP status: Due, Education has been provided regarding the importance of this vaccine. Advised may receive this vaccine at local pharmacy or Health Dept. Aware to provide a copy of the vaccination record if obtained from local pharmacy or Health Dept. Verbalized acceptance and understanding.  Flu Vaccine status: Completed at today's visit  Pneumococcal vaccine status: Up to date  Covid-19 vaccine status: Completed vaccines  Qualifies for Shingles Vaccine? Yes   Zostavax completed Yes   Shingrix Completed?: No.    Education has been provided regarding the importance of this vaccine. Patient has been advised to call insurance company to determine out of pocket expense if they have not yet received this vaccine. Advised may also receive vaccine at local pharmacy or Health Dept. Verbalized acceptance and understanding.  Screening Tests Health Maintenance  Topic Date Due   Hepatitis C Screening  Never done   DTaP/Tdap/Td (2 - Tdap) 01/16/2019   COVID-19 Vaccine (3 - Moderna risk series) 09/18/2019   Pneumonia  Vaccine  67+ Years old (2 - PPSV23 or PCV20) 05/28/2020   Zoster Vaccines- Shingrix (2 of 2) 09/09/2021   MAMMOGRAM  06/16/2022   Medicare Annual Wellness (AWV)  07/01/2023   COLONOSCOPY (Pts 45-50yr Insurance coverage will need to be confirmed)  02/11/2029   INFLUENZA VACCINE  Completed   DEXA SCAN  Completed   HPV VACCINES  Aged Out    Health Maintenance  Health Maintenance Due  Topic Date Due   Hepatitis C Screening  Never done   DTaP/Tdap/Td (2 - Tdap) 01/16/2019   COVID-19 Vaccine (3 - Moderna risk series) 09/18/2019   Pneumonia Vaccine 71 Years old (2 - PPSV23 or PCV20) 05/28/2020   Zoster Vaccines- Shingrix (2 of 2) 09/09/2021   MAMMOGRAM  06/16/2022    Colorectal cancer screening: Type of screening: Colonoscopy. Completed 2020. Repeat every 10 years  Mammogram status: Ordered today. Pt provided with contact info and advised to call to schedule appt.   Bone Density status: Completed 2022. Results reflect: Bone density results: NORMAL. Repeat every 2 years.  Lung Cancer Screening: (Low Dose CT Chest recommended if Age 71-80years, 30 pack-year currently smoking OR have quit w/in 15years.) does qualify.   Lung Cancer Screening Referral: not done, patient had done LDCT 2 years back  Additional Screening:  Hepatitis C Screening: does not qualify; Declined  Vision Screening: Recommended annual ophthalmology exams for early detection of glaucoma and other disorders of the eye. Is the patient up to date with their annual eye exam?  Yes  Who is the provider or what is the name of the office in which the patient attends [facility] If pt is not established with a provider, would they like to be referred to a provider to establish care? No .   Dental Screening: Recommended annual dental exams for proper oral hygiene  Community Resource Referral / Chronic Care Management: CRR required this visit?  No   CCM required this visit?  No      Plan:     Problem List Items Addressed This Visit       Other   Screening mammogram for breast cancer - Primary   Relevant Orders   MM Digital Screening   Need for immunization against influenza   Relevant Orders   Flu Vaccine QUAD High Dose(Fluad) (Completed)      I have personally reviewed and noted the following in the patient's chart:   Medical and social history Use of alcohol, tobacco or illicit drugs  Current medications and supplements including opioid prescriptions. Patient is not currently taking opioid prescriptions. Functional ability and status Nutritional status Physical activity Advanced directives List of other physicians Hospitalizations, surgeries, and ER visits in previous 12 months Vitals Screenings to include cognitive, depression, and falls Referrals and appointments  In addition, I have reviewed and discussed with patient certain preventive protocols, quality metrics, and best practice recommendations. A written personalized care plan for preventive services as well as general preventive health recommendations were provided to patient.   Follow up: Next year for Annual wellness visit  I, Kirsten Cox have reviewed all documentation for this visit. The documentation on 06/30/22   for the exam, diagnosis, procedures, and orders are all accurate and complete.     RNeil Crouch [place]

## 2022-07-05 DIAGNOSIS — C44629 Squamous cell carcinoma of skin of left upper limb, including shoulder: Secondary | ICD-10-CM | POA: Diagnosis not present

## 2022-07-05 DIAGNOSIS — C44729 Squamous cell carcinoma of skin of left lower limb, including hip: Secondary | ICD-10-CM | POA: Diagnosis not present

## 2022-07-09 ENCOUNTER — Encounter: Payer: Self-pay | Admitting: Nurse Practitioner

## 2022-07-14 ENCOUNTER — Ambulatory Visit (INDEPENDENT_AMBULATORY_CARE_PROVIDER_SITE_OTHER): Payer: Medicare Other | Admitting: Nurse Practitioner

## 2022-07-14 ENCOUNTER — Encounter: Payer: Self-pay | Admitting: Nurse Practitioner

## 2022-07-14 VITALS — BP 122/70 | HR 84 | Temp 97.2°F | Resp 18 | Ht 62.0 in | Wt 173.0 lb

## 2022-07-14 DIAGNOSIS — Z23 Encounter for immunization: Secondary | ICD-10-CM | POA: Diagnosis not present

## 2022-07-14 DIAGNOSIS — G720 Drug-induced myopathy: Secondary | ICD-10-CM

## 2022-07-14 DIAGNOSIS — Z1231 Encounter for screening mammogram for malignant neoplasm of breast: Secondary | ICD-10-CM

## 2022-07-14 DIAGNOSIS — T466X5A Adverse effect of antihyperlipidemic and antiarteriosclerotic drugs, initial encounter: Secondary | ICD-10-CM

## 2022-07-14 DIAGNOSIS — R5383 Other fatigue: Secondary | ICD-10-CM | POA: Diagnosis not present

## 2022-07-14 DIAGNOSIS — F17210 Nicotine dependence, cigarettes, uncomplicated: Secondary | ICD-10-CM

## 2022-07-14 DIAGNOSIS — M8580 Other specified disorders of bone density and structure, unspecified site: Secondary | ICD-10-CM

## 2022-07-14 DIAGNOSIS — Z780 Asymptomatic menopausal state: Secondary | ICD-10-CM

## 2022-07-14 DIAGNOSIS — E782 Mixed hyperlipidemia: Secondary | ICD-10-CM | POA: Diagnosis not present

## 2022-07-14 DIAGNOSIS — I700 Atherosclerosis of aorta: Secondary | ICD-10-CM | POA: Diagnosis not present

## 2022-07-14 MED ORDER — EZETIMIBE 10 MG PO TABS: 10.0000 mg | ORAL_TABLET | Freq: Every day | ORAL | 3 refills | Status: DC

## 2022-07-14 MED ORDER — CALCIUM-VITAMIN D 600-5 MG-MCG PO TABS: 1.0000 | ORAL_TABLET | Freq: Every day | ORAL | 1 refills | Status: DC

## 2022-07-14 NOTE — Assessment & Plan Note (Signed)
Vitamin D and calcium started Last T score from 2023 -1.9  Will repeat bone density in next two years Discussed taking cheese, milk and calcium rich diets

## 2022-07-14 NOTE — Assessment & Plan Note (Signed)
Zetia added Will repeat lipid profile today

## 2022-07-14 NOTE — Progress Notes (Signed)
Subjective:  Patient ID: Julia Norman, female    DOB: [birth year]  Age: 72 y.o. MRN: [id number]  Chief Complaints: Chronic follow up and fatigue  History of Present illness:  72 yrs old Ms Stokes with a history of hyperlipidemia,  chronic smoker came to the clinic for a chronic follow up.  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  She is feeling more tired lately . Patient lives alone with two dogs. She was referred to abdomen pelvis CT scan back on October 2023, she wants to discuss the results of that. She did bone density 06/08/2021 and her T-score was -1.9, but she is not any calcium and vitamin D supplements. She said she was never informed about that. She is due for mammogram which she said she will call them to schedule the appointment . She said he cholesterol level is high mainly LDL and she could not tolerate statin, she tried Pravastatin  back on 07/15/2021 for 2-3 months and quit in between due to muscle pain since then she is not taking anything else. Patient also wants to do some work up for her fatigue today which is ongoing for 5-6 months. Besides she denies any other complaints.   Lipid/Cholesterol, Follow-up  Last lipid panel Other pertinent labs  Lab Results  Component Value Date   CHOL 210 (H) 07/15/2021   HDL 46 07/15/2021   LDLCALC 130 (H) 07/15/2021   LDLDIRECT 128.2 05/27/2014   TRIG 190 (H) 07/15/2021   CHOLHDL 4.6 (H) 07/15/2021   Lab Results  Component Value Date   ALT 14 03/05/2022   AST 19 03/05/2022   PLT 323 03/05/2022   TSH 1.170 03/05/2022     She was last seen for this 1 years ago.  Management includes patient is not taking any medicine as of right now, tried pravastatin in past, could not tolerate that.  She reports good compliance with treatment. She is not having side effects.   Current diet: in general, a "healthy" diet   Current exercise: walking  The 10-year ASCVD risk score  (Arnett DK, et al., 2019) is: 14.6%    Current Outpatient Medications on File Prior to Visit  Medication Sig Dispense Refill   tamsulosin (FLOMAX) 0.4 MG CAPS capsule      No current facility-administered medications on file prior to visit.   Past Medical History:  Diagnosis Date   Cancer [facility])    skin   Kidney stones    Past Surgical History:  Procedure Laterality Date   APPENDECTOMY  02/18/2019   CHOLECYSTECTOMY      Family History  Problem Relation Age of Onset   Cancer Mother        lymphoma   Depression Mother    Gallbladder disease Mother    Heart disease Father    Breast cancer Maternal Aunt    Breast cancer Paternal Aunt    Social History   Socioeconomic History   Marital status: Widowed    Spouse name: Not on file   Number of children: 2   Years of education: Not on file   Highest education level: Not on file  Occupational History   Occupation: customer service  Tobacco Use   Smoking status: Every Day    Packs/day: 0.50    Types: Cigarettes   Smokeless tobacco: Never  Substance and Sexual Activity   Alcohol use: Yes    Comment: ocassionally   Drug  use: No   Sexual activity: Yes    Partners: Male  Other Topics Concern   Not on file  Social History Narrative   Not on file   Social Determinants of Health   Financial Resource Strain: Low Risk  (06/30/2022)   Overall Financial Resource Strain (CARDIA)    Difficulty of Paying Living Expenses: Not hard at all  Food Insecurity: No Food Insecurity (06/30/2022)   Hunger Vital Sign    Worried About Running Out of Food in the Last Year: Never true    Ran Out of Food in the Last Year: Never true  Transportation Needs: No Transportation Needs (06/30/2022)   PRAPARE - Hydrologist (Medical): No    Lack of Transportation (Non-Medical): No  Physical Activity: Sufficiently Active (06/30/2022)   Exercise Vital Sign    Days of Exercise per Week: 7 days    Minutes of Exercise per  Session: 40 min  Stress: No Stress Concern Present (06/30/2022)   [place]    Feeling of Stress : Only a little  Social Connections: Moderately Isolated (06/30/2022)   Social Connection and Isolation Panel [NHANES]    Frequency of Communication with Friends and Family: More than three times a week    Frequency of Social Gatherings with Friends and Family: Once a week    Attends Religious Services: More than 4 times per year    Active Member of [employer] or Organizations: No    Attends Archivist Meetings: Never    Marital Status: Widowed    Review of Systems  Constitutional:  Positive for fatigue. Negative for chills.  HENT:  Negative for congestion, ear pain, sinus pain and sore throat.   Respiratory:  Negative for cough and shortness of breath.   Cardiovascular:  Negative for chest pain and leg swelling.  Gastrointestinal:  Negative for abdominal pain, constipation, diarrhea, nausea and vomiting.  Genitourinary:  Negative for dysuria and frequency.  Musculoskeletal:  Negative for arthralgias, back pain and myalgias.  Neurological:  Negative for dizziness and headaches.     Objective:  BP 122/70   Pulse 84   Temp (!) 97.2 F (36.2 C)   Resp 18   Ht '5\' 2"'$  (1.575 m)   Wt 173 lb (78.5 kg)   LMP 06/23/2005 (Approximate) Comment: 10 years ago   SpO2 97%   BMI 31.64 kg/m      07/14/2022    8:38 AM 06/30/2022   11:57 AM 03/05/2022   11:41 AM  BP/Weight  Systolic BP 324 401 027  Diastolic BP 70 72 64  Wt. (Lbs) 173 173.6 172.4  BMI 31.64 kg/m2 30.75 kg/m2 30.54 kg/m2    Physical Exam Vitals reviewed.  Constitutional:      Appearance: Normal appearance. She is normal weight.  Neck:     Vascular: No carotid bruit.  Cardiovascular:     Rate and Rhythm: Normal rate and regular rhythm.     Heart sounds: Normal heart sounds.  Pulmonary:     Effort: Pulmonary effort is normal.     Breath sounds:  Normal breath sounds.  Abdominal:     General: Abdomen is flat. Bowel sounds are normal.     Palpations: Abdomen is soft.     Tenderness: There is no abdominal tenderness.  Musculoskeletal:     Cervical back: Normal range of motion.  Neurological:     Mental Status: She is alert and oriented to person,  place, and time.  Psychiatric:        Mood and Affect: Mood normal.        Behavior: Behavior normal.     Lab Results  Component Value Date   WBC 8.0 03/05/2022   HGB 15.8 03/05/2022   HCT 46.1 03/05/2022   PLT 323 03/05/2022   GLUCOSE 96 03/05/2022   CHOL 210 (H) 07/15/2021   TRIG 190 (H) 07/15/2021   HDL 46 07/15/2021   LDLDIRECT 128.2 05/27/2014   LDLCALC 130 (H) 07/15/2021   ALT 14 03/05/2022   AST 19 03/05/2022   NA 140 03/05/2022   K 4.4 03/05/2022   CL 104 03/05/2022   CREATININE 0.71 03/05/2022   BUN 10 03/05/2022   CO2 21 03/05/2022   TSH 1.170 03/05/2022     Assessment & Plan:  Mixed hyperlipidemia Assessment & Plan: Zetia added Will repeat lipid profile today  Orders: -     Ezetimibe; Take 1 tablet (10 mg total) by mouth daily.  Dispense: 90 tablet; Refill: 3 -     CBC with Differential/Platelet -     Comprehensive metabolic panel -     Hemoglobin A1c -     Lipid panel  Aortic atherosclerosis (HCC) Assessment & Plan: Patient said she cannot tolerate Pravastatin in past, added Zetia today    Recent abdomen Pelvis CT scan results:  IMPRESSION:  1. No acute findings within the abdomen or pelvis.  2. Punctate bilateral nonobstructing renal calculi.  3. Sigmoid diverticulosis without signs of acute diverticulitis.  4. Calcified uterine fibroids.  5.  Aortic Atherosclerosis (ICD10-I70.0).      Statin myopathy Assessment & Plan: Zetia 10 mg started   Other fatigue Assessment & Plan: Vitamin D, iron panel and B12 will be checked today and go from there  Orders: -     VITAMIN D 25 Hydroxy (Vit-D Deficiency, Fractures) -     Vitamin  B12 -     Iron, TIBC and Ferritin Panel -     TSH  Osteopenia after menopause Assessment & Plan: Vitamin D and calcium started Last T score from 2023 -1.9  Will repeat bone density in next two years Discussed taking cheese, milk and calcium rich diets  Orders: -     Calcium-Vitamin D; Take 1 each by mouth daily.  Dispense: 90 tablet; Refill: 1  Encounter for immunization Assessment & Plan: Covid Booster given  Orders: [employer] Fall 2023 Covid-19 Vaccine 73yr and older  Cigarette smoker Assessment & Plan: Taking half a pack cigarettes per day Smoking cessation discussed Talked about alternatives nicotin Patch and Chantix   Screening mammogram for breast cancer Assessment & Plan: Mammogram scheduled for Feb 2024     Follow-up:  I, RNeil Crouchhave reviewed all documentation for this visit. The documentation on 07/14/22   for the exam, diagnosis, procedures, and orders are all accurate and complete.     An After Visit Summary was printed and given to the patient.  RNeil Crouch DNP, [place]([phone]

## 2022-07-14 NOTE — Assessment & Plan Note (Signed)
Vitamin D, iron panel and B12 will be checked today and go from there

## 2022-07-14 NOTE — Assessment & Plan Note (Signed)
Taking half a pack cigarettes per day Smoking cessation discussed Talked about alternatives nicotin Patch and Chantix

## 2022-07-14 NOTE — Assessment & Plan Note (Signed)
Patient said she cannot tolerate Pravastatin in past, added Zetia today    Recent abdomen Pelvis CT scan results:  IMPRESSION:  1. No acute findings within the abdomen or pelvis.  2. Punctate bilateral nonobstructing renal calculi.  3. Sigmoid diverticulosis without signs of acute diverticulitis.  4. Calcified uterine fibroids.  5.  Aortic Atherosclerosis (ICD10-I70.0).

## 2022-07-14 NOTE — Assessment & Plan Note (Signed)
Zetia 10 mg started

## 2022-07-14 NOTE — Assessment & Plan Note (Signed)
Mammogram scheduled for Feb 2024

## 2022-07-14 NOTE — Assessment & Plan Note (Signed)
Covid Booster given

## 2022-07-14 NOTE — Patient Instructions (Signed)
Follow up in 3 months    Nonalcoholic Fatty Liver Disease Diet, Adult Nonalcoholic fatty liver disease is a condition that causes fat to build up in and around the liver. The disease makes it harder for the liver to work the way that it should. Following a healthy diet can help to keep nonalcoholic fatty liver disease under control. It can also help to prevent or improve conditions that are associated with the disease, such as heart disease, diabetes, high blood pressure, and abnormal cholesterol levels. Along with regular exercise, this diet: Promotes weight loss. Helps to control blood sugar levels. Helps to improve the way that the body uses insulin. What are tips for following this plan? Reading food labels Always check food labels for: The amount of saturated fat in a food. You should limit your intake of saturated fat. Saturated fat is found in foods that come from animals, including meat and dairy products such as butter, cheese, and whole milk. The amount of fiber in a food. You should choose high-fiber foods such as fruits, vegetables, and whole grains. Try to get 25-30 grams (g) of fiber a day.  Cooking When cooking, use heart-healthy oils that are high in monounsaturated fats. These include olive oil, canola oil, and avocado oil. Limit frying or deep-frying foods. Cook foods using healthy methods such as baking, boiling, steaming, and grilling instead. Meal planning You may want to keep track of how many calories you take in. Eating the right amount of calories will help you achieve a healthy weight. Meeting with a registered dietitian can help you get started. Limit how often you eat takeout and fast food. These foods are usually very high in fat, salt, and sugar. Use the glycemic index (GI) to plan your meals. The index tells you how quickly a food will raise your blood sugar. Choose low-GI foods (GI less than 55). These foods take a longer time to raise blood sugar. A registered  dietitian can help you identify foods lower on the GI scale. Lifestyle You may want to follow a Mediterranean diet. This diet includes a lot of vegetables, lean meats or fish, whole grains, fruits, and healthy oils and fats. What foods can I eat?  Fruits Bananas. Apples. Oranges. Grapes. Papaya. Mango. Pomegranate. Kiwi. Grapefruit. Cherries. Vegetables Lettuce. Spinach. Peas. Beets. Cauliflower. Cabbage. Broccoli. Carrots. Tomatoes. Squash. Eggplant. Herbs. Peppers. Onions. Cucumbers. Brussels sprouts. Yams and sweet potatoes. Beans. Lentils. Grains Whole wheat or whole-grain foods, including breads, crackers, cereals, and pasta. Stone-ground whole wheat. Unsweetened oatmeal. Bulgur. Barley. Quinoa. Brown or wild rice. Corn or whole wheat flour tortillas. Meats and other proteins Lean meats. Poultry. Tofu. Seafood and shellfish. Dairy Low-fat or fat-free dairy products, such as yogurt, cottage cheese, or cheese. Beverages Water. Sugar-free drinks. Tea. Coffee. Low-fat or skim milk. Milk alternatives, such as soy or almond milk. Real fruit juice. Fats and oils Avocado. Canola or olive oil. Nuts and nut butters. Seeds. Seasonings and condiments Mustard. Relish. Low-fat, low-sugar ketchup and barbecue sauce. Low-fat or fat-free mayonnaise. Sweets and desserts Sugar-free sweets. The items listed above may not be a complete list of foods and beverages you can eat. Contact a dietitian for more information. What foods should I limit or avoid? Meats and other proteins Limit red meat to 1-2 times a week. Dairy [employer]. Fats and oils Palm oil and coconut oil. Fried foods. Other foods Processed foods. Foods that contain a lot of salt or sodium. Sweets and desserts Sweets that contain sugar. Beverages Sweetened  drinks, such as sweet tea, milkshakes, iced sweet drinks, and sodas. Alcohol. The items listed above may not be a complete list of foods and beverages you should avoid.  Contact a dietitian for more information. Where to find more information The [employer] of Diabetes and Digestive and Kidney Diseases: [employer] Summary Nonalcoholic fatty liver disease is a condition that causes fat to build up in and around the liver. Following a healthy diet can help to keep nonalcoholic fatty liver disease under control. Your diet should be rich in fruits, vegetables, whole grains, and lean proteins. Limit your intake of saturated fat. Saturated fat is found in foods that come from animals, including meat and dairy products such as butter, cheese, and whole milk. This diet promotes weight loss, helps to control blood sugar levels, and helps to improve the way that the body uses insulin. This information is not intended to replace advice given to you by your health care provider. Make sure you discuss any questions you have with your health care provider. Document Revised: 09/29/2018 Document Reviewed: 06/29/2018 Elsevier Patient Education  [place].

## 2022-07-15 LAB — IRON,TIBC AND FERRITIN PANEL
Ferritin: 497 ng/mL — ABNORMAL HIGH (ref 15–150)
Iron Saturation: 58 % — ABNORMAL HIGH (ref 15–55)
Iron: 147 ug/dL — ABNORMAL HIGH (ref 27–139)
Total Iron Binding Capacity: 253 ug/dL (ref 250–450)
UIBC: 106 ug/dL — ABNORMAL LOW (ref 118–369)

## 2022-07-15 LAB — COMPREHENSIVE METABOLIC PANEL
ALT: 14 IU/L (ref 0–32)
AST: 14 IU/L (ref 0–40)
Albumin/Globulin Ratio: 1.6 (ref 1.2–2.2)
Albumin: 4.4 g/dL (ref 3.9–4.9)
Alkaline Phosphatase: 73 IU/L (ref 44–121)
BUN/Creatinine Ratio: 23 (ref 12–28)
BUN: 16 mg/dL (ref 8–27)
Bilirubin Total: 0.3 mg/dL (ref 0.0–1.2)
CO2: 20 mmol/L (ref 20–29)
Calcium: 9.9 mg/dL (ref 8.7–10.3)
Chloride: 103 mmol/L (ref 96–106)
Creatinine, Ser: 0.71 mg/dL (ref 0.57–1.00)
Globulin, Total: 2.7 g/dL (ref 1.5–4.5)
Glucose: 98 mg/dL (ref 70–99)
Potassium: 4.8 mmol/L (ref 3.5–5.2)
Sodium: 142 mmol/L (ref 134–144)
Total Protein: 7.1 g/dL (ref 6.0–8.5)
eGFR: 91 mL/min/{1.73_m2} (ref >59)

## 2022-07-15 LAB — CBC WITH DIFFERENTIAL/PLATELET
Basophils Absolute: 0 10*3/uL (ref 0.0–0.2)
Basos: 1 %
EOS (ABSOLUTE): 0.1 10*3/uL (ref 0.0–0.4)
Eos: 2 %
Hematocrit: 47.3 % — ABNORMAL HIGH (ref 34.0–46.6)
Hemoglobin: 15.7 g/dL (ref 11.1–15.9)
Immature Grans (Abs): 0 10*3/uL (ref 0.0–0.1)
Immature Granulocytes: 0 %
Lymphocytes Absolute: 1.8 10*3/uL (ref 0.7–3.1)
Lymphs: 21 %
MCH: 31.1 pg (ref 26.6–33.0)
MCHC: 33.2 g/dL (ref 31.5–35.7)
MCV: 94 fL (ref 79–97)
Monocytes Absolute: 0.7 10*3/uL (ref 0.1–0.9)
Monocytes: 8 %
Neutrophils Absolute: 5.9 10*3/uL (ref 1.4–7.0)
Neutrophils: 68 %
Platelets: 289 10*3/uL (ref 150–450)
RBC: 5.05 x10E6/uL (ref 3.77–5.28)
RDW: 12.6 % (ref 11.7–15.4)
WBC: 8.5 10*3/uL (ref 3.4–10.8)

## 2022-07-15 LAB — HEMOGLOBIN A1C
Est. average glucose Bld gHb Est-mCnc: 114 mg/dL
Hgb A1c MFr Bld: 5.6 % (ref 4.8–5.6)

## 2022-07-15 LAB — VITAMIN D 25 HYDROXY (VIT D DEFICIENCY, FRACTURES): Vit D, 25-Hydroxy: 12.2 ng/mL — ABNORMAL LOW (ref 30.0–100.0)

## 2022-07-15 LAB — LIPID PANEL
Chol/HDL Ratio: 4 ratio (ref 0.0–4.4)
Cholesterol, Total: 226 mg/dL — ABNORMAL HIGH (ref 100–199)
HDL: 56 mg/dL (ref >39)
LDL Chol Calc (NIH): 150 mg/dL — ABNORMAL HIGH (ref 0–99)
Triglycerides: 112 mg/dL (ref 0–149)
VLDL Cholesterol Cal: 20 mg/dL (ref 5–40)

## 2022-07-15 LAB — VITAMIN B12: Vitamin B-12: 318 pg/mL (ref 232–1245)

## 2022-07-15 LAB — CARDIOVASCULAR RISK ASSESSMENT

## 2022-07-15 LAB — TSH: TSH: 2.07 u[IU]/mL (ref 0.450–4.500)

## 2022-08-31 ENCOUNTER — Telehealth: Payer: Self-pay

## 2022-08-31 NOTE — Telephone Encounter (Signed)
I called the patient today regarding her upcoming appointment in April with Rekha. Patient notified. Patient stated that she will call back to rs. Rekha's last day is 09/10/2022. THIS APPOINTMENT WILL NEED TO BE RESCHEDULED WITH SALLY OR DR. COX ON OR AROUND 01/13/2023. Waiting for the patient to call back

## 2022-10-15 ENCOUNTER — Ambulatory Visit: Payer: Medicare Other | Admitting: Nurse Practitioner

## 2022-11-22 ENCOUNTER — Other Ambulatory Visit: Payer: Self-pay | Admitting: Family Medicine

## 2022-11-22 ENCOUNTER — Encounter: Payer: Self-pay | Admitting: Acute Care

## 2022-11-22 DIAGNOSIS — Z0000 Encounter for general adult medical examination without abnormal findings: Secondary | ICD-10-CM

## 2022-11-22 DIAGNOSIS — Z23 Encounter for immunization: Secondary | ICD-10-CM

## 2022-11-22 DIAGNOSIS — Z1231 Encounter for screening mammogram for malignant neoplasm of breast: Secondary | ICD-10-CM

## 2022-11-24 ENCOUNTER — Ambulatory Visit
Admission: RE | Admit: 2022-11-24 | Discharge: 2022-11-24 | Disposition: A | Discharge status: Home / Self Care | Payer: Medicare Other | Source: Ambulatory Visit | Attending: Family Medicine | Admitting: Family Medicine

## 2022-11-24 DIAGNOSIS — Z1231 Encounter for screening mammogram for malignant neoplasm of breast: Secondary | ICD-10-CM | POA: Diagnosis not present

## 2022-11-25 ENCOUNTER — Ambulatory Visit: Payer: BC Managed Care – PPO

## 2022-12-16 DIAGNOSIS — H04123 Dry eye syndrome of bilateral lacrimal glands: Secondary | ICD-10-CM | POA: Diagnosis not present

## 2022-12-16 DIAGNOSIS — H5211 Myopia, right eye: Secondary | ICD-10-CM | POA: Diagnosis not present

## 2023-01-31 NOTE — Progress Notes (Unsigned)
Subjective:  Patient ID: [name], female    DOB: 30-Jul-1950  Age: 72 y.o. MRN: [id number]  Chief Complaint  Patient presents with   Medical Management of Chronic Issues    HPI   72 yrs old Ms Germain with a history of hyperlipidemia,  chronic smoker came to the clinic for a chronic follow up.   Flew back from [place] 3 days ago.      Lipid/Cholesterol, Follow-up   She was last seen for this 8 months ago.  Management includes zetia 10 mg daily  She reports good compliance with treatment, unless she ran out and did not refill it. HAS BEEN OFF OF ZETIA FOR A MONTH.  She is not having side effects.  States she cannot take statins.   Current diet: in general, a "healthy" diet. Does not "eat a whole lot of bad stuff" but could eat large portions. Current exercise: walking  She mentioned to have dull, aching pain on right foot and heel since 2 months ago. She is using a brace sometimes, but it did not help her. PAIN WITH FIRST FEW STEPS IN THE MORNING and worse with excess walking etc.  Reports urinary incontinence, urgency, stress incontinence.  Smokes 1 pack every 3-4 days. Has cut down a whole lot.      02/01/2023   10:05 AM 06/30/2022   12:00 PM 03/10/2022   11:41 AM 05/28/2021    3:08 PM 04/02/2020    4:36 PM  Depression screen PHQ 2/9  Decreased Interest 0 0 0 0 1  Down, Depressed, Hopeless 0 0 0 0 1  PHQ - 2 Score 0 0 0 0 2  Altered sleeping 0    1  Tired, decreased energy 1    1  Change in appetite 1    1  Feeling bad or failure about yourself  0    1  Trouble concentrating 0    1  Moving slowly or fidgety/restless 0    1  Suicidal thoughts 0    0  PHQ-9 Score 2    8  Difficult doing work/chores     Somewhat difficult        02/01/2023   10:05 AM  Fall Risk   Falls in the past year? 0  Number falls in past yr: 0  Injury with Fall? 0  Risk for fall due to : No Fall Risks  Follow up Education provided      Review of Systems  Constitutional:   Negative for chills, fatigue and fever.  HENT:  Negative for congestion, ear pain and sore throat.   Respiratory:  Negative for cough and shortness of breath.   Cardiovascular:  Negative for chest pain and palpitations.  Gastrointestinal:  Negative for abdominal pain, constipation, diarrhea, nausea and vomiting.  Endocrine: Negative for polydipsia, polyphagia and polyuria.  Genitourinary:  Positive for frequency and urgency. Negative for difficulty urinating and dysuria.  Musculoskeletal:  Positive for arthralgias (right heel and foot pain). Negative for back pain and myalgias.  Skin:  Negative for rash.  Neurological:  Negative for headaches.  Psychiatric/Behavioral:  Negative for dysphoric mood. The patient is not nervous/anxious.     Current Outpatient Medications on File Prior to Visit  Medication Sig Dispense Refill   Calcium-Vitamin D 600-5 MG-MCG TABS Take 1 each by mouth daily. 90 tablet 1   No current facility-administered medications on file prior to visit.   Past Medical History:  Diagnosis Date   Cancer (HCC)  skin   Kidney stones    Urinary frequency 03/14/2022   Past Surgical History:  Procedure Laterality Date   APPENDECTOMY  02/18/2019   CHOLECYSTECTOMY      Family History  Problem Relation Age of Onset   Cancer Mother        lymphoma   Depression Mother    Gallbladder disease Mother    Heart disease Father    Breast cancer Maternal Aunt    Breast cancer Paternal Aunt    Social History   Socioeconomic History   Marital status: Widowed    Spouse name: Not on file   Number of children: 2   Years of education: Not on file   Highest education level: Not on file  Occupational History   Occupation: customer service  Tobacco Use   Smoking status: Every Day    Current packs/day: 0.25    Average packs/day: 0.3 packs/day for 50.0 years (12.5 ttl pk-yrs)    Types: Cigarettes    Start date: 01/31/1973   Smokeless tobacco: Never  Vaping Use   Vaping  status: Never Used  Substance and Sexual Activity   Alcohol use: Yes    Comment: ocassionally   Drug use: No   Sexual activity: Yes    Partners: Male  Other Topics Concern   Not on file  Social History Narrative   Not on file   Social Determinants of Health   Financial Resource Strain: Low Risk  (06/30/2022)   Overall Financial Resource Strain (CARDIA)    Difficulty of Paying Living Expenses: Not hard at all  Food Insecurity: No Food Insecurity (06/30/2022)   Hunger Vital Sign    Worried About Running Out of Food in the Last Year: Never true    Ran Out of Food in the Last Year: Never true  Transportation Needs: No Transportation Needs (06/30/2022)   PRAPARE - Administrator, Civil Service (Medical): No    Lack of Transportation (Non-Medical): No  Physical Activity: Sufficiently Active (06/30/2022)   Exercise Vital Sign    Days of Exercise per Week: 7 days    Minutes of Exercise per Session: 40 min  Stress: No Stress Concern Present (06/30/2022)   [employer] of Occupational Health - Occupational Stress Questionnaire    Feeling of Stress : Only a little  Social Connections: Moderately Isolated (06/30/2022)   Social Connection and Isolation Panel [NHANES]    Frequency of Communication with Friends and Family: More than three times a week    Frequency of Social Gatherings with Friends and Family: Once a week    Attends Religious Services: More than 4 times per year    Active Member of [employer] or Organizations: No    Attends Banker Meetings: Never    Marital Status: Widowed    Objective:  BP 100/72   Pulse 67   Temp 97.9 F (36.6 C)   Resp 14   Ht 5\' 2"  (1.575 m)   Wt 175 lb (79.4 kg)   LMP 06/23/2005 (Approximate) Comment: 10 years ago   SpO2 96%   BMI 32.01 kg/m      02/01/2023    9:47 AM 07/14/2022    8:38 AM 06/30/2022   11:57 AM  BP/Weight  Systolic BP 100 122 124  Diastolic BP 72 70 72  Wt. (Lbs) 175 173 173.6  BMI 32.01 kg/m2  31.64 kg/m2 30.75 kg/m2    Physical Exam Vitals and nursing note reviewed.  Constitutional:  Appearance: Normal appearance.  HENT:     Head: Normocephalic and atraumatic.     Mouth/Throat:     Mouth: Mucous membranes are moist.     Pharynx: Oropharynx is clear.  Eyes:     Extraocular Movements: Extraocular movements intact.     Pupils: Pupils are equal, round, and reactive to light.  Cardiovascular:     Rate and Rhythm: Normal rate and regular rhythm.  Pulmonary:     Effort: Pulmonary effort is normal.     Breath sounds: Normal breath sounds.  Musculoskeletal:        General: Normal range of motion.     Comments: Tenderness right heel  Skin:    General: Skin is warm and dry.  Neurological:     General: No focal deficit present.     Mental Status: She is alert and oriented to person, place, and time. Mental status is at baseline.  Psychiatric:        Mood and Affect: Mood normal.        Behavior: Behavior normal.     Diabetic Foot Exam - Simple   No data filed      Lab Results  Component Value Date   WBC 8.5 07/14/2022   HGB 15.7 07/14/2022   HCT 47.3 (H) 07/14/2022   PLT 289 07/14/2022   GLUCOSE 98 07/14/2022   CHOL 226 (H) 07/14/2022   TRIG 112 07/14/2022   HDL 56 07/14/2022   LDLDIRECT 128.2 05/27/2014   LDLCALC 150 (H) 07/14/2022   ALT 14 07/14/2022   AST 14 07/14/2022   NA 142 07/14/2022   K 4.8 07/14/2022   CL 103 07/14/2022   CREATININE 0.71 07/14/2022   BUN 16 07/14/2022   CO2 20 07/14/2022   TSH 2.070 07/14/2022   HGBA1C 5.6 07/14/2022      Assessment & Plan:    Aortic atherosclerosis (HCC) Assessment & Plan: Will recommend to continue Zetia 10 mg daily   Mixed hyperlipidemia Assessment & Plan: History of hyperlipidemia, statin intolerant due to reported myopathy. Has been on Zetia but did not fill the medication after she ran out a month ago  Plan: Recheck lipids today -If elevated, she will be recommended to go back on Zetia  10 mg daily along with healthy low calorie diet and increased exercise  Orders: -     CBC with Differential/Platelet -     Comprehensive metabolic panel -     Lipid panel  Cigarette nicotine dependence without complication Assessment & Plan: Smokes 1 pack every 3 to 4 days.  States she has cut down a lot, does not want to quit at this time.   Mixed stress and urge urinary incontinence Assessment & Plan: Reports mixed stress and urge urinary incontinence Urine analysis today showed slightly increased specific gravity and moderate blood, will send the urine sample for culture and treat only if cultures show bacteria as her symptoms have been present for at least couple months now  For the incontinence, recommended lifestyle modifications, Kegel's exercises, weight management, scheduled toileting  Started her on Myrbetriq to see if it helps.  Follow-up in 2 to 3 months for this   Dysuria Assessment & Plan: Urine sent for culture.  Orders: -     POCT URINALYSIS DIP (CLINITEK) -     Urine Culture  Chronic heel pain, right Assessment & Plan: Chronic right heel pain which on exam appears to be plantar fasciitis Recommended weight management, shoe insoles Printed exercises for plantar fasciitis  rehab  Report back if worse   Other orders -     Mirabegron ER; Take 1 tablet (25 mg total) by mouth daily.  Dispense: 30 tablet; Refill: 2     Meds ordered this encounter  Medications   mirabegron ER (MYRBETRIQ) 25 MG TB24 tablet    Sig: Take 1 tablet (25 mg total) by mouth daily.    Dispense:  30 tablet    Refill:  2    Orders Placed This Encounter  Procedures   Urine Culture   CBC with Differential/Platelet   Comprehensive metabolic panel   Lipid panel   POCT URINALYSIS DIP (CLINITEK)     Follow-up: Return in about 3 months (around 05/04/2023) for chronic disease follow up.   I,Marla I Leal-Borjas,acting as a scribe for [employer], MD.,have documented all  relevant documentation on the behalf of Windell Moment, MD,as directed by  Windell Moment, MD while in the presence of Windell Moment, MD.   An After Visit Summary was printed and given to the patient.  Windell Moment, MD [facility] [phone]

## 2023-02-01 ENCOUNTER — Ambulatory Visit (INDEPENDENT_AMBULATORY_CARE_PROVIDER_SITE_OTHER): Payer: Medicare Other

## 2023-02-01 VITALS — BP 100/72 | HR 67 | Temp 97.9°F | Resp 14 | Ht 62.0 in | Wt 175.0 lb

## 2023-02-01 DIAGNOSIS — R300 Dysuria: Secondary | ICD-10-CM

## 2023-02-01 DIAGNOSIS — N3946 Mixed incontinence: Secondary | ICD-10-CM | POA: Insufficient documentation

## 2023-02-01 DIAGNOSIS — F17210 Nicotine dependence, cigarettes, uncomplicated: Secondary | ICD-10-CM | POA: Diagnosis not present

## 2023-02-01 DIAGNOSIS — E782 Mixed hyperlipidemia: Secondary | ICD-10-CM | POA: Diagnosis not present

## 2023-02-01 DIAGNOSIS — G8929 Other chronic pain: Secondary | ICD-10-CM

## 2023-02-01 DIAGNOSIS — I700 Atherosclerosis of aorta: Secondary | ICD-10-CM

## 2023-02-01 DIAGNOSIS — M79671 Pain in right foot: Secondary | ICD-10-CM

## 2023-02-01 LAB — CBC WITH DIFFERENTIAL/PLATELET
Basophils Absolute: 0.1 10*3/uL (ref 0.0–0.2)
Basos: 1 %
EOS (ABSOLUTE): 0.2 10*3/uL (ref 0.0–0.4)
Eos: 3 %
Hematocrit: 45.2 % (ref 34.0–46.6)
Hemoglobin: 15.3 g/dL (ref 11.1–15.9)
Immature Grans (Abs): 0 10*3/uL (ref 0.0–0.1)
Immature Granulocytes: 0 %
Lymphocytes Absolute: 1.6 10*3/uL (ref 0.7–3.1)
Lymphs: 22 %
MCH: 32 pg (ref 26.6–33.0)
MCHC: 33.8 g/dL (ref 31.5–35.7)
MCV: 95 fL (ref 79–97)
Monocytes Absolute: 0.6 10*3/uL (ref 0.1–0.9)
Monocytes: 8 %
Neutrophils Absolute: 4.6 10*3/uL (ref 1.4–7.0)
Neutrophils: 66 %
Platelets: 255 10*3/uL (ref 150–450)
RBC: 4.78 x10E6/uL (ref 3.77–5.28)
RDW: 13.1 % (ref 11.7–15.4)
WBC: 7 10*3/uL (ref 3.4–10.8)

## 2023-02-01 LAB — POCT URINALYSIS DIP (CLINITEK)
Bilirubin, UA: NEGATIVE
Glucose, UA: NEGATIVE mg/dL
Ketones, POC UA: NEGATIVE mg/dL
Leukocytes, UA: NEGATIVE
Nitrite, UA: NEGATIVE
POC PROTEIN,UA: NEGATIVE
Spec Grav, UA: >=1.03 — AB (ref 1.010–1.025)
Urobilinogen, UA: 0.2 E.U./dL
pH, UA: 5.5 (ref 5.0–8.0)

## 2023-02-01 LAB — COMPREHENSIVE METABOLIC PANEL
ALT: 16 IU/L (ref 0–32)
AST: 16 IU/L (ref 0–40)
Albumin: 4.1 g/dL (ref 3.8–4.8)
Alkaline Phosphatase: 67 IU/L (ref 44–121)
BUN/Creatinine Ratio: 19 (ref 12–28)
BUN: 14 mg/dL (ref 8–27)
Bilirubin Total: 0.4 mg/dL (ref 0.0–1.2)
CO2: 24 mmol/L (ref 20–29)
Calcium: 9.8 mg/dL (ref 8.7–10.3)
Chloride: 105 mmol/L (ref 96–106)
Creatinine, Ser: 0.75 mg/dL (ref 0.57–1.00)
Globulin, Total: 2.5 g/dL (ref 1.5–4.5)
Glucose: 96 mg/dL (ref 70–99)
Potassium: 4.8 mmol/L (ref 3.5–5.2)
Sodium: 140 mmol/L (ref 134–144)
Total Protein: 6.6 g/dL (ref 6.0–8.5)
eGFR: 85 mL/min/{1.73_m2} (ref >59)

## 2023-02-01 LAB — LIPID PANEL
Chol/HDL Ratio: 4.6 ratio — ABNORMAL HIGH (ref 0.0–4.4)
Cholesterol, Total: 223 mg/dL — ABNORMAL HIGH (ref 100–199)
HDL: 48 mg/dL (ref >39)
LDL Chol Calc (NIH): 147 mg/dL — ABNORMAL HIGH (ref 0–99)
Triglycerides: 158 mg/dL — ABNORMAL HIGH (ref 0–149)
VLDL Cholesterol Cal: 28 mg/dL (ref 5–40)

## 2023-02-01 MED ORDER — MIRABEGRON ER 25 MG PO TB24: 25.0000 mg | ORAL_TABLET | Freq: Every day | ORAL | 2 refills | Status: DC

## 2023-02-01 NOTE — Assessment & Plan Note (Signed)
Smokes 1 pack every 3 to 4 days.  States she has cut down a lot, does not want to quit at this time.

## 2023-02-01 NOTE — Assessment & Plan Note (Signed)
Urine sent for culture

## 2023-02-01 NOTE — Assessment & Plan Note (Signed)
Will recommend to continue Zetia 10 mg daily

## 2023-02-01 NOTE — Patient Instructions (Signed)
Blood work today Will only treat you for urine infection if the cultures show bacteria Sent medicine called MYRBETRIQ to see if it helps with the urinary problem Try KEGELS EXERCISES Try shoe insoles for plantar fasciitis and exercises.

## 2023-02-01 NOTE — Assessment & Plan Note (Signed)
History of hyperlipidemia, statin intolerant due to reported myopathy. Has been on Zetia but did not fill the medication after she ran out a month ago  Plan: Recheck lipids today -If elevated, she will be recommended to go back on Zetia 10 mg daily along with healthy low calorie diet and increased exercise

## 2023-02-01 NOTE — Assessment & Plan Note (Addendum)
Reports mixed stress and urge urinary incontinence Urine analysis today showed slightly increased specific gravity and moderate blood, will send the urine sample for culture and treat only if cultures show bacteria as her symptoms have been present for at least couple months now  For the incontinence, recommended lifestyle modifications, Kegel's exercises, weight management, scheduled toileting  Started her on Myrbetriq to see if it helps.  Follow-up in 2 to 3 months for this

## 2023-02-01 NOTE — Assessment & Plan Note (Signed)
Chronic right heel pain which on exam appears to be plantar fasciitis Recommended weight management, shoe insoles Printed exercises for plantar fasciitis rehab  Report back if worse

## 2023-02-08 ENCOUNTER — Other Ambulatory Visit: Payer: Self-pay

## 2023-02-08 MED ORDER — EZETIMIBE 10 MG PO TABS: 10.0000 mg | ORAL_TABLET | Freq: Every day | ORAL | 1 refills | Status: DC

## 2023-03-16 ENCOUNTER — Ambulatory Visit: Payer: Medicare Other

## 2023-03-16 DIAGNOSIS — M1990 Unspecified osteoarthritis, unspecified site: Secondary | ICD-10-CM | POA: Diagnosis not present

## 2023-03-16 DIAGNOSIS — M722 Plantar fascial fibromatosis: Secondary | ICD-10-CM | POA: Diagnosis not present

## 2023-04-19 DIAGNOSIS — H9193 Unspecified hearing loss, bilateral: Secondary | ICD-10-CM | POA: Diagnosis not present

## 2023-05-05 ENCOUNTER — Ambulatory Visit: Payer: Medicare Other

## 2023-05-05 VITALS — BP 110/78 | HR 80 | Temp 98.2°F | Resp 16 | Ht 62.0 in | Wt 172.0 lb

## 2023-05-05 DIAGNOSIS — Z23 Encounter for immunization: Secondary | ICD-10-CM

## 2023-05-05 DIAGNOSIS — F17210 Nicotine dependence, cigarettes, uncomplicated: Secondary | ICD-10-CM | POA: Diagnosis not present

## 2023-05-05 DIAGNOSIS — I700 Atherosclerosis of aorta: Secondary | ICD-10-CM

## 2023-05-05 DIAGNOSIS — N3946 Mixed incontinence: Secondary | ICD-10-CM | POA: Diagnosis not present

## 2023-05-05 DIAGNOSIS — Z0000 Encounter for general adult medical examination without abnormal findings: Secondary | ICD-10-CM | POA: Insufficient documentation

## 2023-05-05 DIAGNOSIS — R2689 Other abnormalities of gait and mobility: Secondary | ICD-10-CM | POA: Insufficient documentation

## 2023-05-05 MED ORDER — EZETIMIBE 10 MG PO TABS: 10.0000 mg | ORAL_TABLET | Freq: Every day | ORAL | 1 refills | Status: DC

## 2023-05-05 NOTE — Assessment & Plan Note (Signed)
Intermittent off-balance sensation in the mornings, likely related to decreased muscle strength and balance with age. Discussed the importance of strength training and balance exercises. Patient prefers to try exercises at home before considering physical therapy. - Recommend strength training and balance exercises - Consider referral to physical therapy if symptoms worsen - Monitor blood pressure regularly

## 2023-05-05 NOTE — Assessment & Plan Note (Signed)
Elevated cholesterol levels and aortic atherosclerosis noted in August 2024: total cholesterol 223 mg/dL, triglycerides 010 mg/dL, LDL 272 mg/dL. ASCVD risk score 13.1%. Patient previously discontinued Zetia due to perceived side effects but agrees to restart. - Restart Zetia (ezetimibe) 10 mg daily in the evening - Provide new prescription for Zetia - Repeat blood work in three months

## 2023-05-05 NOTE — Assessment & Plan Note (Addendum)
Ordered lung cancer screening CT scan Advised to cut back and quit smoking NO charge

## 2023-05-05 NOTE — Progress Notes (Signed)
Subjective:  Patient ID: Julia Norman, female    DOB: Jun 07, 1951  Age: 72 y.o. MRN: [id number]  Chief Complaint  Patient presents with   Medical Management of Chronic Issues    HPI    The patient, with a history of hyperlipidemia, bladder incontinence, and tobacco use, presents with occasional morning imbalance. She describes the sensation as not being dizzy, but rather feeling off balance, particularly when performing tasks such as dressing without holding onto anything. This has not resulted in any falls, except for one incident due to tripping over an object.  The patient also reports a history of hyperlipidemia, for which she was prescribed Zetia (ezetimibe) in Jan 2024. However, she discontinued the medication around March 2024 due to perceived side effects, including muscle discomfort. Despite this, she expresses willingness to restart the medication after being informed of her elevated cardiac risk score.  Regarding bladder incontinence, the patient has been prescribed Myrbetriq, which she takes occasionally and finds helpful. She also reports right ankle pain, for which she received a cortisone shot from an orthopedic doctor. The shot has provided some relief, although the pain recurs, particularly with extensive walking.  The patient is not engaged in regular exercise but stays active by walking her dog and caring for her grandchildren. She has recently made dietary changes, including reducing her intake of sweets, and has lost some weight. She continues to smoke, consuming approximately a pack of cigarettes every three days.  The 10-year ASCVD risk score (Arnett DK, et al., 2019) is: 13.1%   Values used to calculate the score:     Age: 20 years     Sex: Female     Is Non-Hispanic African American: No     Diabetic: No     Tobacco smoker: Yes     Systolic Blood Pressure: 110 mmHg     Is BP treated: No     HDL Cholesterol: 48 mg/dL     Total Cholesterol: 223 mg/dL      09/27/8117   14:78 AM 06/30/2022   12:00 PM 03/10/2022   11:41 AM 05/28/2021    3:08 PM 04/02/2020    4:36 PM  Depression screen PHQ 2/9  Decreased Interest 0 0 0 0 1  Down, Depressed, Hopeless 0 0 0 0 1  PHQ - 2 Score 0 0 0 0 2  Altered sleeping 0    1  Tired, decreased energy 1    1  Change in appetite 1    1  Feeling bad or failure about yourself  0    1  Trouble concentrating 0    1  Moving slowly or fidgety/restless 0    1  Suicidal thoughts 0    0  PHQ-9 Score 2    8  Difficult doing work/chores     Somewhat difficult        02/01/2023   10:05 AM  Fall Risk   Falls in the past year? 0  Number falls in past yr: 0  Injury with Fall? 0  Risk for fall due to : No Fall Risks  Follow up Education provided    Patient Care Team: Windell Moment, MD as PCP - General (Family Medicine)   Review of Systems  Constitutional: Negative.   HENT: Negative.    Eyes: Negative.   Respiratory: Negative.    Cardiovascular: Negative.   Gastrointestinal: Negative.   Neurological:        Balance concerns    Current Outpatient Medications  on File Prior to Visit  Medication Sig Dispense Refill   Calcium-Vitamin D 600-5 MG-MCG TABS Take 1 each by mouth daily. 90 tablet 1   mirabegron ER (MYRBETRIQ) 25 MG TB24 tablet Take 1 tablet (25 mg total) by mouth daily. 30 tablet 2   No current facility-administered medications on file prior to visit.   Past Medical History:  Diagnosis Date   Cancer (HCC)    skin   Kidney stones    Urinary frequency 03/14/2022   Past Surgical History:  Procedure Laterality Date   APPENDECTOMY  02/18/2019   CHOLECYSTECTOMY      Family History  Problem Relation Age of Onset   Cancer Mother        lymphoma   Depression Mother    Gallbladder disease Mother    Heart disease Father    Breast cancer Maternal Aunt    Breast cancer Paternal Aunt    Social History   Socioeconomic History   Marital status: Widowed    Spouse name: Not on file   Number of  children: 2   Years of education: Not on file   Highest education level: Not on file  Occupational History   Occupation: customer service  Tobacco Use   Smoking status: Every Day    Current packs/day: 0.25    Average packs/day: 0.3 packs/day for 50.3 years (12.6 ttl pk-yrs)    Types: Cigarettes    Start date: 01/31/1973   Smokeless tobacco: Never  Vaping Use   Vaping status: Never Used  Substance and Sexual Activity   Alcohol use: Yes    Comment: ocassionally   Drug use: No   Sexual activity: Yes    Partners: Male  Other Topics Concern   Not on file  Social History Narrative   Not on file   Social Determinants of Health   Financial Resource Strain: Low Risk  (06/30/2022)   Overall Financial Resource Strain (CARDIA)    Difficulty of Paying Living Expenses: Not hard at all  Food Insecurity: No Food Insecurity (06/30/2022)   Hunger Vital Sign    Worried About Running Out of Food in the Last Year: Never true    Ran Out of Food in the Last Year: Never true  Transportation Needs: No Transportation Needs (06/30/2022)   PRAPARE - Administrator, Civil Service (Medical): No    Lack of Transportation (Non-Medical): No  Physical Activity: Sufficiently Active (06/30/2022)   Exercise Vital Sign    Days of Exercise per Week: 7 days    Minutes of Exercise per Session: 40 min  Stress: No Stress Concern Present (06/30/2022)   [employer] of Occupational Health - Occupational Stress Questionnaire    Feeling of Stress : Only a little  Social Connections: Moderately Isolated (06/30/2022)   Social Connection and Isolation Panel [NHANES]    Frequency of Communication with Friends and Family: More than three times a week    Frequency of Social Gatherings with Friends and Family: Once a week    Attends Religious Services: More than 4 times per year    Active Member of [employer] or Organizations: No    Attends Banker Meetings: Never    Marital Status: Widowed     Objective:  BP 110/78 (BP Location: Left Arm, Patient Position: Sitting, Cuff Size: Large)   Pulse 80   Temp 98.2 F (36.8 C) (Temporal)   Resp 16   Ht 5\' 2"  (1.575 m)   Wt 172 lb (78 kg)  LMP 06/23/2005 (Approximate) Comment: 10 years ago   SpO2 96%   BMI 31.46 kg/m      05/05/2023    9:17 AM 02/01/2023    9:47 AM 07/14/2022    8:38 AM  BP/Weight  Systolic BP 110 100 122  Diastolic BP 78 72 70  Wt. (Lbs) 172 175 173  BMI 31.46 kg/m2 32.01 kg/m2 31.64 kg/m2    Physical Exam Vitals and nursing note reviewed.  Constitutional:      Appearance: She is obese.  HENT:     Head: Normocephalic and atraumatic.  Cardiovascular:     Rate and Rhythm: Normal rate and regular rhythm.  Pulmonary:     Effort: Pulmonary effort is normal.     Breath sounds: Normal breath sounds.  Musculoskeletal:        General: Normal range of motion.  Neurological:     General: No focal deficit present.     Mental Status: She is alert.  Psychiatric:        Mood and Affect: Mood normal.        Behavior: Behavior normal.     Diabetic Foot Exam - Simple   No data filed      Lab Results  Component Value Date   WBC 7.0 02/01/2023   HGB 15.3 02/01/2023   HCT 45.2 02/01/2023   PLT 255 02/01/2023   GLUCOSE 96 02/01/2023   CHOL 223 (H) 02/01/2023   TRIG 158 (H) 02/01/2023   HDL 48 02/01/2023   LDLDIRECT 128.2 05/27/2014   LDLCALC 147 (H) 02/01/2023   ALT 16 02/01/2023   AST 16 02/01/2023   NA 140 02/01/2023   K 4.8 02/01/2023   CL 105 02/01/2023   CREATININE 0.75 02/01/2023   BUN 14 02/01/2023   CO2 24 02/01/2023   TSH 2.070 07/14/2022   HGBA1C 5.6 07/14/2022      Assessment & Plan:    Aortic atherosclerosis (HCC) Assessment & Plan: Elevated cholesterol levels and aortic atherosclerosis noted in August 2024: total cholesterol 223 mg/dL, triglycerides 244 mg/dL, LDL 010 mg/dL. ASCVD risk score 13.1%. Patient previously discontinued Zetia due to perceived side effects but  agrees to restart. - Restart Zetia (ezetimibe) 10 mg daily in the evening - Provide new prescription for Zetia - Repeat blood work in three months    Mixed stress and urge urinary incontinence Assessment & Plan: Occasional use of Myrbetriq for bladder incontinence, which the patient finds helpful when taken. Prefers to use it as needed based on symptoms. - Continue Myrbetriq as needed   Cigarette smoker Assessment & Plan: Ordered lung cancer screening CT scan Advised to cut back and quit smoking NO charge  Orders: -     CT CHEST LUNG CANCER SCREENING LOW DOSE WO CONTRAST; Future  Balance problem Assessment & Plan: Intermittent off-balance sensation in the mornings, likely related to decreased muscle strength and balance with age. Discussed the importance of strength training and balance exercises. Patient prefers to try exercises at home before considering physical therapy. - Recommend strength training and balance exercises - Consider referral to physical therapy if symptoms worsen - Monitor blood pressure regularly    Healthcare maintenance Assessment & Plan: Engages in daily activities such as walking the dog and caring for grandchildren. Blood pressure well-controlled at 110/78 mmHg. Working on improving diet and reducing sweets intake. Discussed the importance of reducing smoking to lower cardiac risk. - Continue calcium and vitamin D supplementation - Encourage healthy eating and regular exercise - Recommend reducing  smoking to lower cardiac risk - Administer vaccines (shingles, tetanus, and possibly RSV) at the pharmacy FLU SHOT GIVEN IN OFFICE TODAY - Order annual lung cancer screening CT scan  Follow-up - Schedule follow-up appointment in three months - Ensure fasting before next blood work.   Other orders -     Ezetimibe; Take 1 tablet (10 mg total) by mouth daily.  Dispense: 90 tablet; Refill: 1     Meds ordered this encounter  Medications   ezetimibe  (ZETIA) 10 MG tablet    Sig: Take 1 tablet (10 mg total) by mouth daily.    Dispense:  90 tablet    Refill:  1    Orders Placed This Encounter  Procedures   CT CHEST LUNG CA SCREEN LOW DOSE W/O CM     Follow-up: Return in about 3 months (around 08/05/2023) for chronic disease follow up.   I,Angela Taylor,acting as a Neurosurgeon for [employer], MD.,have documented all relevant documentation on the behalf of Windell Moment, MD,as directed by  Windell Moment, MD while in the presence of Windell Moment, MD.   An After Visit Summary was printed and given to the patient.  Windell Moment, MD [facility] [phone]

## 2023-05-05 NOTE — Patient Instructions (Signed)
Please take your zetia. Sent it to your pharmacy Do balance exercises Cut back further on smoking Ordered CT lung cancer screen Return in 3 months

## 2023-05-05 NOTE — Assessment & Plan Note (Signed)
Occasional use of Myrbetriq for bladder incontinence, which the patient finds helpful when taken. Prefers to use it as needed based on symptoms. - Continue Myrbetriq as needed

## 2023-05-05 NOTE — Assessment & Plan Note (Addendum)
Engages in daily activities such as walking the dog and caring for grandchildren. Blood pressure well-controlled at 110/78 mmHg. Working on improving diet and reducing sweets intake. Discussed the importance of reducing smoking to lower cardiac risk. - Continue calcium and vitamin D supplementation - Encourage healthy eating and regular exercise - Recommend reducing smoking to lower cardiac risk - Administer vaccines (shingles, tetanus, and possibly RSV) at the pharmacy FLU SHOT GIVEN IN OFFICE TODAY - Order annual lung cancer screening CT scan  Follow-up - Schedule follow-up appointment in three months - Ensure fasting before next blood work.

## 2023-05-06 ENCOUNTER — Telehealth: Payer: Self-pay

## 2023-05-06 NOTE — Telephone Encounter (Signed)
   Julia Norman has been scheduled for the following appointment:  WHAT: CT LUNG SCREENING WHERE: [facility] DATE: 05/10/2023 TIME: 12:45 PM CHECK-IN  A message has been left for the patient.

## 2023-05-09 ENCOUNTER — Telehealth: Payer: Self-pay

## 2023-05-09 NOTE — Telephone Encounter (Signed)
Message was sent to clinical staff to get order faxed over

## 2023-05-10 DIAGNOSIS — Z122 Encounter for screening for malignant neoplasm of respiratory organs: Secondary | ICD-10-CM | POA: Diagnosis not present

## 2023-05-10 DIAGNOSIS — Z87891 Personal history of nicotine dependence: Secondary | ICD-10-CM | POA: Diagnosis not present

## 2023-05-11 DIAGNOSIS — Z87891 Personal history of nicotine dependence: Secondary | ICD-10-CM | POA: Diagnosis not present

## 2023-06-07 DIAGNOSIS — M722 Plantar fascial fibromatosis: Secondary | ICD-10-CM | POA: Diagnosis not present

## 2023-06-07 DIAGNOSIS — R768 Other specified abnormal immunological findings in serum: Secondary | ICD-10-CM | POA: Diagnosis not present

## 2023-08-09 ENCOUNTER — Ambulatory Visit (INDEPENDENT_AMBULATORY_CARE_PROVIDER_SITE_OTHER): Payer: Medicare Other

## 2023-08-09 VITALS — BP 124/70 | HR 79 | Temp 97.9°F | Ht 62.0 in | Wt 173.8 lb

## 2023-08-09 DIAGNOSIS — M25561 Pain in right knee: Secondary | ICD-10-CM | POA: Insufficient documentation

## 2023-08-09 DIAGNOSIS — E782 Mixed hyperlipidemia: Secondary | ICD-10-CM | POA: Diagnosis not present

## 2023-08-09 DIAGNOSIS — N3946 Mixed incontinence: Secondary | ICD-10-CM | POA: Diagnosis not present

## 2023-08-09 DIAGNOSIS — F17210 Nicotine dependence, cigarettes, uncomplicated: Secondary | ICD-10-CM | POA: Diagnosis not present

## 2023-08-09 DIAGNOSIS — R079 Chest pain, unspecified: Secondary | ICD-10-CM | POA: Insufficient documentation

## 2023-08-09 MED ORDER — EZETIMIBE 10 MG PO TABS: 10.0000 mg | ORAL_TABLET | Freq: Every day | ORAL | 1 refills | Status: DC

## 2023-08-09 NOTE — Progress Notes (Signed)
 Subjective:  Patient ID: Julia Norman, female    DOB: 02-04-1951  Age: 73 y.o. MRN: [id number]  Chief Complaint  Patient presents with   Medical Management of Chronic Issues    HPI   Julia Norman is a 73 year old female who presents with recent chest pain and right knee pain.  She has been experiencing chest pain on the left side of her chest, which began in the early hours of Friday morning. The pain is described as an inability to take a deep breath, with a 'catching' sensation when attempting to breathe deeply. This episode persisted through Friday and part of Saturday but has since resolved. There was no pain upon palpation of the chest during this time. She did not seek emergency care and is concerned about the possibility of a heart attack.  She reports right knee pain that has been present for about a month. The pain is described as a 'knife stabbing' sensation when kneeling or when the knee is hit in a certain way. The pain is localized to a specific spot below the knee and is palpable. She has not had any x-rays of the knee. She recalls a previous visit to a rheumatologist where some levels were noted to be high, but not concerning enough to warrant further action.  Her current medications include calcium, vitamin D, Zetia, and Myrbetriq. She is inconsistent with taking her cholesterol medication and occasionally takes her bladder medication as needed.  She smokes approximately five to six cigarettes a day, having reduced her intake to about a fourth of a pack daily. She does not express a strong desire to quit smoking, citing enjoyment of smoking after meals.  She is physically active, often walking and engaging in activities with her grandchildren, and reports an average of 5,500 steps per day.DENIES ANY CHEST PAIN WITH THESE ACTIVITIES.     08/09/2023    8:36 AM 02/01/2023   10:05 AM 06/30/2022   12:00 PM 03/10/2022   11:41 AM 05/28/2021    3:08 PM  Depression screen PHQ 2/9   Decreased Interest 0 0 0 0 0  Down, Depressed, Hopeless 0 0 0 0 0  PHQ - 2 Score 0 0 0 0 0  Altered sleeping  0     Tired, decreased energy  1     Change in appetite  1     Feeling bad or failure about yourself   0     Trouble concentrating  0     Moving slowly or fidgety/restless  0     Suicidal thoughts  0     PHQ-9 Score  2           08/09/2023    8:36 AM  Fall Risk   Falls in the past year? 0  Number falls in past yr: 0  Injury with Fall? 0  Risk for fall due to : No Fall Risks    Patient Care Team: Windell Moment, MD as PCP - General (Family Medicine)   Review of Systems  Constitutional:  Negative for chills, fatigue and fever.  HENT:  Negative for congestion, ear pain and sore throat.   Respiratory:  Positive for chest tightness (episode of chest spasm couple days ago). Negative for cough and shortness of breath.   Cardiovascular:  Negative for chest pain.  Gastrointestinal:  Negative for abdominal pain, constipation, diarrhea, nausea and vomiting.  Genitourinary:  Negative for dysuria and frequency.  Musculoskeletal:  Positive for arthralgias (right  knee pain). Negative for myalgias.  Neurological:  Negative for dizziness and headaches.  Psychiatric/Behavioral:  Negative for dysphoric mood. The patient is not nervous/anxious.     Current Outpatient Medications on File Prior to Visit  Medication Sig Dispense Refill   Calcium-Vitamin D 600-5 MG-MCG TABS Take 1 each by mouth daily. 90 tablet 1   mirabegron ER (MYRBETRIQ) 25 MG TB24 tablet Take 1 tablet (25 mg total) by mouth daily. 30 tablet 2   No current facility-administered medications on file prior to visit.   Past Medical History:  Diagnosis Date   Cancer (HCC)    skin   Kidney stones    Urinary frequency 03/14/2022   Past Surgical History:  Procedure Laterality Date   APPENDECTOMY  02/18/2019   CHOLECYSTECTOMY      Family History  Problem Relation Age of Onset   Cancer Mother        lymphoma    Depression Mother    Gallbladder disease Mother    Heart disease Father    Breast cancer Maternal Aunt    Breast cancer Paternal Aunt    Social History   Socioeconomic History   Marital status: Widowed    Spouse name: Not on file   Number of children: 2   Years of education: Not on file   Highest education level: Not on file  Occupational History   Occupation: customer service  Tobacco Use   Smoking status: Every Day    Current packs/day: 0.25    Average packs/day: 0.3 packs/day for 50.5 years (12.6 ttl pk-yrs)    Types: Cigarettes    Start date: 01/31/1973   Smokeless tobacco: Never  Vaping Use   Vaping status: Never Used  Substance and Sexual Activity   Alcohol use: Yes    Comment: ocassionally   Drug use: No   Sexual activity: Yes    Partners: Male  Other Topics Concern   Not on file  Social History Narrative   Not on file   Social Drivers of Health   Financial Resource Strain: Low Risk  (06/30/2022)   Overall Financial Resource Strain (CARDIA)    Difficulty of Paying Living Expenses: Not hard at all  Food Insecurity: No Food Insecurity (06/30/2022)   Hunger Vital Sign    Worried About Running Out of Food in the Last Year: Never true    Ran Out of Food in the Last Year: Never true  Transportation Needs: No Transportation Needs (06/30/2022)   PRAPARE - Administrator, Civil Service (Medical): No    Lack of Transportation (Non-Medical): No  Physical Activity: Sufficiently Active (06/30/2022)   Exercise Vital Sign    Days of Exercise per Week: 7 days    Minutes of Exercise per Session: 40 min  Stress: No Stress Concern Present (06/30/2022)   [employer] of Occupational Health - Occupational Stress Questionnaire    Feeling of Stress : Only a little  Social Connections: Moderately Isolated (06/30/2022)   Social Connection and Isolation Panel [NHANES]    Frequency of Communication with Friends and Family: More than three times a week    Frequency of  Social Gatherings with Friends and Family: Once a week    Attends Religious Services: More than 4 times per year    Active Member of [employer] or Organizations: No    Attends Banker Meetings: Never    Marital Status: Widowed    Objective:  BP 124/70   Pulse 79   Temp 97.9  F (36.6 C)   Ht 5\' 2"  (1.575 m)   Wt 173 lb 12.8 oz (78.8 kg)   LMP 06/23/2005 (Approximate) Comment: 10 years ago   SpO2 97%   BMI 31.79 kg/m      08/09/2023    8:34 AM 05/05/2023    9:17 AM 02/01/2023    9:47 AM  BP/Weight  Systolic BP 124 110 100  Diastolic BP 70 78 72  Wt. (Lbs) 173.8 172 175  BMI 31.79 kg/m2 31.46 kg/m2 32.01 kg/m2    Physical Exam Vitals and nursing note reviewed.  Constitutional:      Appearance: She is obese.  HENT:     Head: Normocephalic and atraumatic.  Cardiovascular:     Rate and Rhythm: Normal rate and regular rhythm.  Pulmonary:     Effort: Pulmonary effort is normal.     Breath sounds: Normal breath sounds.     Comments: No reproducible chest tenderness Musculoskeletal:        General: Tenderness (mild tenderness in the inferolateral aspect of the right knee, crepitus noted with ROM of the right knee) present.  Neurological:     General: No focal deficit present.     Mental Status: She is alert.  Psychiatric:        Mood and Affect: Mood normal.     Diabetic Foot Exam - Simple   No data filed      Lab Results  Component Value Date   WBC 7.0 02/01/2023   HGB 15.3 02/01/2023   HCT 45.2 02/01/2023   PLT 255 02/01/2023   GLUCOSE 96 02/01/2023   CHOL 223 (H) 02/01/2023   TRIG 158 (H) 02/01/2023   HDL 48 02/01/2023   LDLDIRECT 128.2 05/27/2014   LDLCALC 147 (H) 02/01/2023   ALT 16 02/01/2023   AST 16 02/01/2023   NA 140 02/01/2023   K 4.8 02/01/2023   CL 105 02/01/2023   CREATININE 0.75 02/01/2023   BUN 14 02/01/2023   CO2 24 02/01/2023   TSH 2.070 07/14/2022   HGBA1C 5.6 07/14/2022      Assessment & Plan:    Mixed  hyperlipidemia Assessment & Plan: Non-compliance with Zetia. Patient acknowledges need to improve adherence. Discussed importance of cholesterol management in preventing cardiovascular events. Advised to take medication regularly and follow up after blood work results. - Send prescription for Zetia to pharmacy - Reassess after blood work results  Orders: -     CBC with Differential/Platelet -     Comprehensive metabolic panel -     Lipid panel  Mixed stress and urge urinary incontinence -     CBC with Differential/Platelet -     Comprehensive metabolic panel -     Lipid panel  Cigarette smoker Assessment & Plan: Patient smokes 5-6 cigarettes per day. Not highly motivated to quit but has reduced smoking. Discussed benefits of smoking cessation in reducing cardiovascular risk. Suggested nicotine gum to further reduce cigarette consumption. - Encourage further reduction in smoking - Suggest nicotine gum to reduce cigarette consumption  Orders: -     CBC with Differential/Platelet -     Comprehensive metabolic panel -     Lipid panel  Acute pain of right knee Assessment & Plan: Right knee pain for one month, described as stabbing pain when kneeling and grinding sensation.  Given tenderness in the inferolateral aspect of the right knee, appears to be less likely osteoarthritis AND MORE LIKELY bursitis.  No prior x-rays.  Discussed non-pharmacological management including heat,  ice, elevation, and compression. X-rays may be considered if pain persists or worsens. - Recommend heat, ice, elevation, and compression - Consider x-ray if pain persists or worsens  Orders: -     CBC with Differential/Platelet -     Comprehensive metabolic panel -     Lipid panel  Chest pain, unspecified type Assessment & Plan: Intermittent left-sided chest pain, described as a cramp, lasting from Friday to part of Saturday. No current pain or associated symptoms like cough or dyspnea. No pain on  palpation.   Differential includes musculoskeletal pain, less likely cardiac etiology given symptom resolution.   Advised to monitor for recurrence, especially with exertion, and seek immediate evaluation if symptoms recur or worsen. - Monitor for recurrence, especially with exertion - Increase daily steps by 1000-1500 to assess for recurrence - Order CBC, CMP, and lipid panel CALL 911 IF SHE HAS CHEST PAIN AGAIN    General Health Maintenance Routine health maintenance discussed including exercise, smoking cessation, and cholesterol management. Last colonoscopy in August 2020 showed benign neoplasm; next colonoscopy due in August 2030. Carotid ultrasound in November 2021 was normal; no further testing needed unless symptomatic. - Next colonoscopy due in August 2030  Follow-up - Follow-up visit in 6 months - Notify if right knee pain necessitates x-ray.   Other orders -     Ezetimibe; Take 1 tablet (10 mg total) by mouth daily.  Dispense: 90 tablet; Refill: 1     Meds ordered this encounter  Medications   ezetimibe (ZETIA) 10 MG tablet    Sig: Take 1 tablet (10 mg total) by mouth daily.    Dispense:  90 tablet    Refill:  1    Orders Placed This Encounter  Procedures   CBC with Differential   Comprehensive metabolic panel   Lipid Panel     Follow-up: Return in about 6 months (around 02/06/2024).    An After Visit Summary was printed and given to the patient.  Windell Moment, MD [facility] [phone]

## 2023-08-09 NOTE — Assessment & Plan Note (Addendum)
 Intermittent left-sided chest pain, described as a cramp, lasting from Friday to part of Saturday. No current pain or associated symptoms like cough or dyspnea. No pain on palpation.   Differential includes musculoskeletal pain, less likely cardiac etiology given symptom resolution.   Advised to monitor for recurrence, especially with exertion, and seek immediate evaluation if symptoms recur or worsen. - Monitor for recurrence, especially with exertion - Increase daily steps by 1000-1500 to assess for recurrence - Order CBC, CMP, and lipid panel CALL 911 IF SHE HAS CHEST PAIN AGAIN    General Health Maintenance Routine health maintenance discussed including exercise, smoking cessation, and cholesterol management. Last colonoscopy in August 2020 showed benign neoplasm; next colonoscopy due in August 2030. Carotid ultrasound in November 2021 was normal; no further testing needed unless symptomatic. - Next colonoscopy due in August 2030  Follow-up - Follow-up visit in 6 months - Notify if right knee pain necessitates x-ray.

## 2023-08-09 NOTE — Patient Instructions (Signed)
 VISIT SUMMARY:  Julia Norman, during your visit, we discussed your recent chest pain and right knee pain. We also reviewed your cholesterol management and smoking habits. We have outlined a plan to address each of these issues and ensure your overall health is maintained.  YOUR PLAN:  -CHEST PAIN: Your chest pain, which lasted from Friday to part of Saturday, seems to be musculoskeletal rather than related to your heart. Please monitor for any recurrence, especially with physical activity, and seek immediate care if it happens again. We will also increase your daily steps by 1000-1500 to see if the pain returns. Blood tests have been ordered to check your overall health.  -RIGHT KNEE PAIN: The pain in your right knee could be due to osteoarthritis or bursitis. We recommend using heat, ice, elevation, and compression to manage the pain. If the pain continues or gets worse, we may need to do an x-ray.  -HYPERLIPIDEMIA: Hyperlipidemia means you have high cholesterol levels. It's important to take your Zetia medication regularly to manage your cholesterol and prevent heart problems. We will reassess your condition after we get your blood work results.  -SMOKING CESSATION: You currently smoke 5-6 cigarettes a day. Reducing or quitting smoking can significantly lower your risk of heart disease. We suggest trying nicotine gum to help reduce your cigarette consumption further.  -GENERAL HEALTH MAINTENANCE: We discussed the importance of regular exercise, quitting smoking, and managing your cholesterol. Your last colonoscopy in August 2020 was normal, and your next one is due in August 2030. Your carotid ultrasound in November 2021 was also normal, so no further testing is needed unless you have symptoms.  INSTRUCTIONS:  Please follow up in 6 months. If your right knee pain continues or worsens, notify us as you may need an x-ray. Additionally, monitor your chest pain and seek immediate care if it recurs.

## 2023-08-09 NOTE — Assessment & Plan Note (Signed)
 Patient smokes 5-6 cigarettes per day. Not highly motivated to quit but has reduced smoking. Discussed benefits of smoking cessation in reducing cardiovascular risk. Suggested nicotine gum to further reduce cigarette consumption. - Encourage further reduction in smoking - Suggest nicotine gum to reduce cigarette consumption

## 2023-08-09 NOTE — Assessment & Plan Note (Signed)
 Non-compliance with Zetia. Patient acknowledges need to improve adherence. Discussed importance of cholesterol management in preventing cardiovascular events. Advised to take medication regularly and follow up after blood work results. - Send prescription for Zetia to pharmacy - Reassess after blood work results

## 2023-08-09 NOTE — Assessment & Plan Note (Addendum)
 Right knee pain for one month, described as stabbing pain when kneeling and grinding sensation.  Given tenderness in the inferolateral aspect of the right knee, appears to be less likely osteoarthritis AND MORE LIKELY bursitis.  No prior x-rays.  Discussed non-pharmacological management including heat, ice, elevation, and compression. X-rays may be considered if pain persists or worsens. - Recommend heat, ice, elevation, and compression - Consider x-ray if pain persists or worsens

## 2023-08-10 LAB — LIPID PANEL
Chol/HDL Ratio: 4.6 {ratio} — ABNORMAL HIGH (ref 0.0–4.4)
Cholesterol, Total: 210 mg/dL — ABNORMAL HIGH (ref 100–199)
HDL: 46 mg/dL (ref >39)
LDL Chol Calc (NIH): 143 mg/dL — ABNORMAL HIGH (ref 0–99)
Triglycerides: 114 mg/dL (ref 0–149)
VLDL Cholesterol Cal: 21 mg/dL (ref 5–40)

## 2023-08-10 LAB — CBC WITH DIFFERENTIAL/PLATELET
Basophils Absolute: 0.1 10*3/uL (ref 0.0–0.2)
Basos: 1 %
EOS (ABSOLUTE): 0.2 10*3/uL (ref 0.0–0.4)
Eos: 2 %
Hematocrit: 47.3 % — ABNORMAL HIGH (ref 34.0–46.6)
Hemoglobin: 15.7 g/dL (ref 11.1–15.9)
Immature Grans (Abs): 0 10*3/uL (ref 0.0–0.1)
Immature Granulocytes: 0 %
Lymphocytes Absolute: 2.1 10*3/uL (ref 0.7–3.1)
Lymphs: 25 %
MCH: 31.8 pg (ref 26.6–33.0)
MCHC: 33.2 g/dL (ref 31.5–35.7)
MCV: 96 fL (ref 79–97)
Monocytes Absolute: 0.8 10*3/uL (ref 0.1–0.9)
Monocytes: 9 %
Neutrophils Absolute: 5.3 10*3/uL (ref 1.4–7.0)
Neutrophils: 63 %
Platelets: 287 10*3/uL (ref 150–450)
RBC: 4.93 x10E6/uL (ref 3.77–5.28)
RDW: 13.4 % (ref 11.7–15.4)
WBC: 8.4 10*3/uL (ref 3.4–10.8)

## 2023-08-10 LAB — COMPREHENSIVE METABOLIC PANEL
ALT: 18 [IU]/L (ref 0–32)
AST: 20 [IU]/L (ref 0–40)
Albumin: 4.2 g/dL (ref 3.8–4.8)
Alkaline Phosphatase: 66 [IU]/L (ref 44–121)
BUN/Creatinine Ratio: 19 (ref 12–28)
BUN: 14 mg/dL (ref 8–27)
Bilirubin Total: 0.4 mg/dL (ref 0.0–1.2)
CO2: 21 mmol/L (ref 20–29)
Calcium: 9.8 mg/dL (ref 8.7–10.3)
Chloride: 107 mmol/L — ABNORMAL HIGH (ref 96–106)
Creatinine, Ser: 0.75 mg/dL (ref 0.57–1.00)
Globulin, Total: 2.4 g/dL (ref 1.5–4.5)
Glucose: 93 mg/dL (ref 70–99)
Potassium: 4.8 mmol/L (ref 3.5–5.2)
Sodium: 143 mmol/L (ref 134–144)
Total Protein: 6.6 g/dL (ref 6.0–8.5)
eGFR: 85 mL/min/{1.73_m2} (ref >59)

## 2023-09-06 ENCOUNTER — Encounter: Payer: Self-pay | Admitting: Family Medicine

## 2023-09-06 ENCOUNTER — Ambulatory Visit (INDEPENDENT_AMBULATORY_CARE_PROVIDER_SITE_OTHER): Payer: Medicare Other | Admitting: Family Medicine

## 2023-09-06 VITALS — BP 120/69 | HR 80

## 2023-09-06 DIAGNOSIS — Z0000 Encounter for general adult medical examination without abnormal findings: Secondary | ICD-10-CM

## 2023-09-06 NOTE — Progress Notes (Signed)
 Subjective:   Julia Norman is a 73 y.o. female who presents for Medicare Annual (Subsequent) preventive examination.  Visit Complete: Virtual I connected with  Julia Norman on 09/06/23 by a audio enabled telemedicine application and verified that I am speaking with the correct person using two identifiers.  Patient Location: Home  Provider Location: Office/Clinic  I discussed the limitations of evaluation and management by telemedicine. The patient expressed understanding and agreed to proceed.  Vital Signs: Because this visit was a virtual/telehealth visit, some criteria may be missing or patient reported. Any vitals not documented were not able to be obtained and vitals that have been documented are patient reported.  Patient Medicare AWV questionnaire was completed by the patient on 09/06/2023; I have confirmed that all information answered by patient is correct and no changes since this date.        Objective:    Today's Vitals   09/06/23 0946  BP: 120/69  Pulse: 80  PainSc: 0-No pain   There is no height or weight on file to calculate BMI.     09/06/2023    9:40 AM 06/30/2022   11:58 AM 05/28/2021    3:07 PM  Advanced Directives  Does Patient Have a Medical Advance Directive? No No No  Would patient like information on creating a medical advance directive? Yes (Inpatient - patient defers creating a medical advance directive at this time - Information given) Yes (Inpatient - patient requests chaplain consult to create a medical advance directive)     Current Medications (verified) Outpatient Encounter Medications as of 09/06/2023  Medication Sig   Calcium-Vitamin D 600-5 MG-MCG TABS Take 1 each by mouth daily.   ezetimibe (ZETIA) 10 MG tablet Take 1 tablet (10 mg total) by mouth daily.   mirabegron ER (MYRBETRIQ) 25 MG TB24 tablet Take 1 tablet (25 mg total) by mouth daily.   No facility-administered encounter medications on file as of 09/06/2023.    Allergies  (verified) Codeine   History: Past Medical History:  Diagnosis Date   Cancer (HCC)    skin   Kidney stones    Urinary frequency 03/14/2022   Past Surgical History:  Procedure Laterality Date   APPENDECTOMY  02/18/2019   CHOLECYSTECTOMY     Family History  Problem Relation Age of Onset   Cancer Mother        lymphoma   Depression Mother    Gallbladder disease Mother    Heart disease Father    Breast cancer Maternal Aunt    Breast cancer Paternal Aunt    Social History   Socioeconomic History   Marital status: Widowed    Spouse name: Not on file   Number of children: 2   Years of education: Not on file   Highest education level: Associate degree: occupational, Scientist, product/process development, or vocational program  Occupational History   Occupation: customer service  Tobacco Use   Smoking status: Every Day    Current packs/day: 0.25    Average packs/day: 0.3 packs/day for 50.6 years (12.6 ttl pk-yrs)    Types: Cigarettes    Start date: 01/31/1973   Smokeless tobacco: Never  Vaping Use   Vaping status: Never Used  Substance and Sexual Activity   Alcohol use: Yes    Comment: ocassionally   Drug use: No   Sexual activity: Yes    Partners: Male  Other Topics Concern   Not on file  Social History Narrative   Not on file   Social Drivers  of Health   Financial Resource Strain: Low Risk  (09/06/2023)   Overall Financial Resource Strain (CARDIA)    Difficulty of Paying Living Expenses: Not hard at all  Food Insecurity: No Food Insecurity (09/06/2023)   Hunger Vital Sign    Worried About Running Out of Food in the Last Year: Never true    Ran Out of Food in the Last Year: Never true  Transportation Needs: No Transportation Needs (09/06/2023)   PRAPARE - Administrator, Civil Service (Medical): No    Lack of Transportation (Non-Medical): No  Physical Activity: Sufficiently Active (09/06/2023)   Exercise Vital Sign    Days of Exercise per Week: 5 days    Minutes of Exercise  per Session: 60 min  Stress: No Stress Concern Present (09/06/2023)   [employer] of Occupational Health - Occupational Stress Questionnaire    Feeling of Stress : Only a little  Social Connections: Moderately Integrated (09/06/2023)   Social Connection and Isolation Panel [NHANES]    Frequency of Communication with Friends and Family: More than three times a week    Frequency of Social Gatherings with Friends and Family: Three times a week    Attends Religious Services: More than 4 times per year    Active Member of Clubs or Organizations: Yes    Attends Banker Meetings: 1 to 4 times per year    Marital Status: Widowed    Tobacco Counseling Ready to quit: No Counseling given: Not Answered   Clinical Intake:  Pre-visit preparation completed: Yes  Pain : No/denies pain Pain Score: 0-No pain     Diabetes: No  How often do you need to have someone help you when you read instructions, pamphlets, or other written materials from your doctor or pharmacy?: 1 - Never What is the last grade level you completed in school?: 12th and some college  Interpreter Needed?: No     Activities of Daily Living    09/06/2023    9:35 AM  In your present state of health, do you have any difficulty performing the following activities:  Hearing? 0  Vision? 0  Difficulty concentrating or making decisions? 0  Walking or climbing stairs? 0  Dressing or bathing? 0  Doing errands, shopping? 0  Preparing Food and eating ? N  Using the Toilet? N  In the past six months, have you accidently leaked urine? Y  Do you have problems with loss of bowel control? N  Managing your Medications? N  Managing your Finances? N  Housekeeping or managing your Housekeeping? N    Patient Care Team: Windell Moment, MD as PCP - General (Family Medicine)  Indicate any recent Medical Services you may have received from other than [facility] providers in the past year (date may be approximate).      Assessment:   This is a routine wellness examination for [name].  Hearing/Vision screen No results found.   Goals Addressed             This Visit's Progress    Set My Weight Loss Goal       Follow Up Date 09/06/2023    - set weight loss goal - 30 pounds   Why is this important?  Weddings to attend, healthier Losing only 5 to 15 percent of your weight makes a big difference in your health.    Notes:        Depression Screen    09/06/2023    9:43 AM 08/09/2023  8:36 AM 02/01/2023   10:05 AM 06/30/2022   12:00 PM 03/10/2022   11:41 AM 05/28/2021    3:08 PM 04/02/2020    4:36 PM  PHQ 2/9 Scores  PHQ - 2 Score 0 0 0 0 0 0 2  PHQ- 9 Score   2    8    Fall Risk    09/06/2023    9:41 AM 08/09/2023    8:36 AM 02/01/2023   10:05 AM 06/30/2022   11:58 AM 03/10/2022   11:45 AM  Fall Risk   Falls in the past year? 0 0 0 0 0  Number falls in past yr: 0 0 0 0 0  Injury with Fall? 0 0 0 0 0  Risk for fall due to : No Fall Risks No Fall Risks No Fall Risks No Fall Risks   Follow up Falls evaluation completed  Education provided Education provided;Falls prevention discussed     MEDICARE RISK AT HOME: Medicare Risk at Home Any stairs in or around the home?: No If so, are there any without handrails?: Yes Home free of loose throw rugs in walkways, pet beds, electrical cords, etc?: Yes Adequate lighting in your home to reduce risk of falls?: Yes Life alert?: No Use of a cane, walker or w/c?: No Grab bars in the bathroom?: No Shower chair or bench in shower?: No Elevated toilet seat or a handicapped toilet?: No  TIMED UP AND GO:  Was the test performed?  No    Cognitive Function:        09/06/2023    9:43 AM 06/30/2022   12:07 PM 05/28/2021    3:12 PM  6CIT Screen  What Year? 0 points 0 points 0 points  What month? 0 points 0 points 0 points  What time? 0 points 0 points 0 points  Count back from 20 0 points 0 points 0 points  Months in reverse 0 points 0 points 0  points  Repeat phrase 0 points 0 points 0 points  Total Score 0 points 0 points 0 points    Immunizations Immunization History  Administered Date(s) Administered   Fluad Quad(high Dose 65+) 04/02/2020, 05/28/2021, 06/30/2022   Fluad Trivalent(High Dose 65+) 05/05/2023   Influenza Whole 05/06/2010   Influenza, High Dose Seasonal PF 05/11/2018   Moderna Sars-Covid-2 Vaccination 07/26/2019, 08/21/2019, 05/07/2020   Pfizer(Comirnaty)Fall Seasonal Vaccine 12 years and older 07/14/2022   Pneumococcal Conjugate-13 06/01/2017, 04/02/2020   Td 01/15/2009   Zoster Recombinant(Shingrix) 07/15/2021    TDAP status: Due, Education has been provided regarding the importance of this vaccine. Advised may receive this vaccine at local pharmacy or Health Dept. Aware to provide a copy of the vaccination record if obtained from local pharmacy or Health Dept. Verbalized acceptance and understanding.  Flu Vaccine status: Up to date  Pneumococcal vaccine status: Due, Education has been provided regarding the importance of this vaccine. Advised may receive this vaccine at local pharmacy or Health Dept. Aware to provide a copy of the vaccination record if obtained from local pharmacy or Health Dept. Verbalized acceptance and understanding.  Covid-19 vaccine status: Completed vaccines  Qualifies for Shingles Vaccine? Yes   Zostavax completed No   Shingrix Completed?: No.    Education has been provided regarding the importance of this vaccine. Patient has been advised to call insurance company to determine out of pocket expense if they have not yet received this vaccine. Advised may also receive vaccine at local pharmacy or Health Dept. Verbalized acceptance and  understanding.  Screening Tests Health Maintenance  Topic Date Due   Hepatitis C Screening  Never done   DTaP/Tdap/Td (2 - Tdap) 01/16/2019   Pneumonia Vaccine 55+ Years old (2 of 2 - PPSV23 or PCV20) 05/28/2020   Zoster Vaccines- Shingrix (2 of 2)  09/09/2021   COVID-19 Vaccine (5 - 2024-25 season) 02/20/2023   MAMMOGRAM  11/24/2023   Medicare Annual Wellness (AWV)  09/05/2024   Colonoscopy  02/11/2029   INFLUENZA VACCINE  Completed   DEXA SCAN  Completed   HPV VACCINES  Aged Out    Health Maintenance  Health Maintenance Due  Topic Date Due   Hepatitis C Screening  Never done   DTaP/Tdap/Td (2 - Tdap) 01/16/2019   Pneumonia Vaccine 55+ Years old (2 of 2 - PPSV23 or PCV20) 05/28/2020   Zoster Vaccines- Shingrix (2 of 2) 09/09/2021   COVID-19 Vaccine (5 - 2024-25 season) 02/20/2023    Colorectal cancer screening: Type of screening: Colonoscopy. Completed 02/12/2019. Repeat every 10 years  Mammogram status: Completed 11/24/2022. Repeat every year  Bone Density status: Completed 06/08/2021. Results reflect: Bone density results: OSTEOPENIA. Repeat every 2 years.  Lung Cancer Screening: (Low Dose CT Chest recommended if Age 70-80 years, 20 pack-year currently smoking OR have quit w/in 15years.) does not qualify.   Lung Cancer Screening Referral: Done 05/10/2023  Additional Screening:  Hepatitis C Screening: does qualify. Never done.  Vision Screening: Recommended annual ophthalmology exams for early detection of glaucoma and other disorders of the eye. Is the patient up to date with their annual eye exam?  Yes  Who is the provider or what is the name of the office in which the patient attends annual eye exams? [facility] [place], [place] If pt is not established with a provider, would they like to be referred to a provider to establish care? No .   Dental Screening: Recommended annual dental exams for proper oral hygiene  Diabetic Foot Exam: N/A  Community Resource Referral / Chronic Care Management: CRR required this visit?  No   CCM required this visit?  No     Plan:     I have personally reviewed and noted the following in the patient's chart:   Medical and social history Use of alcohol, tobacco or  illicit drugs  Current medications and supplements including opioid prescriptions. Patient is not currently taking opioid prescriptions. Functional ability and status Nutritional status Physical activity Advanced directives List of other physicians Hospitalizations, surgeries, and ER visits in previous 12 months Vitals Screenings to include cognitive, depression, and falls Referrals and appointments  In addition, I have reviewed and discussed with patient certain preventive protocols, quality metrics, and best practice recommendations. A written personalized care plan for preventive services as well as general preventive health recommendations were provided to patient.   Lajuana Matte, FNP [facility] [phone]   09/06/2023   After Visit Summary: (Declined) Due to this being a telephonic visit, with patients personalized plan was offered to patient but patient Declined AVS at this time

## 2023-10-21 IMAGING — MG MM DIGITAL SCREENING BILAT W/ TOMO AND CAD
8 series · 9 of 24 positions shown · non-contrast
Comparison: Previous exam(s).

CLINICAL DATA: Screening.

EXAM:
DIGITAL SCREENING BILATERAL MAMMOGRAM WITH TOMOSYNTHESIS AND CAD
TECHNIQUE: Bilateral screening digital craniocaudal and mediolateral oblique
mammograms were obtained. Bilateral screening digital breast
tomosynthesis was performed. The images were evaluated with
computer-aided detection.

[R CC synth-2D]
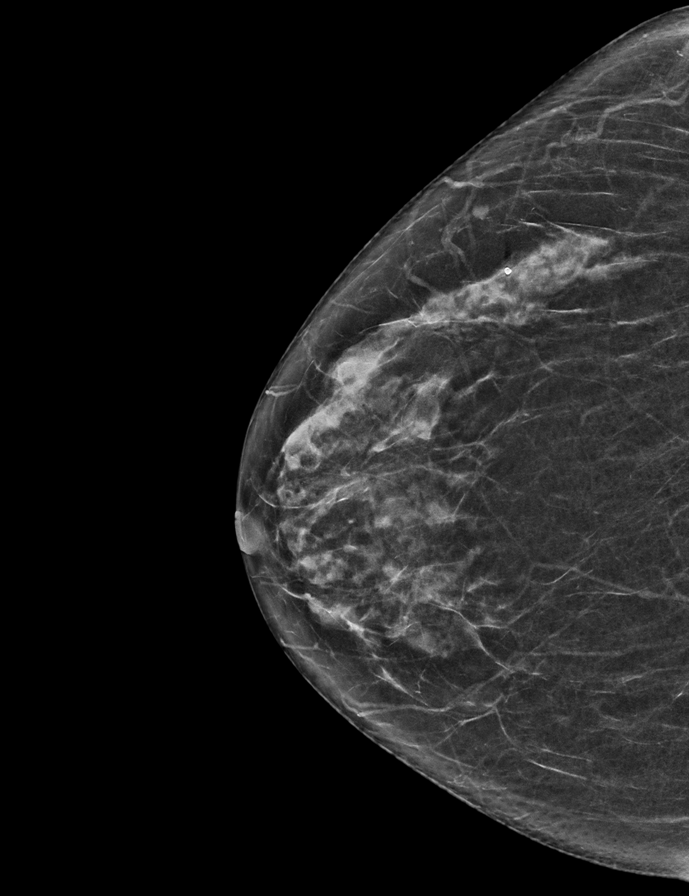

[R MLO synth-2D]
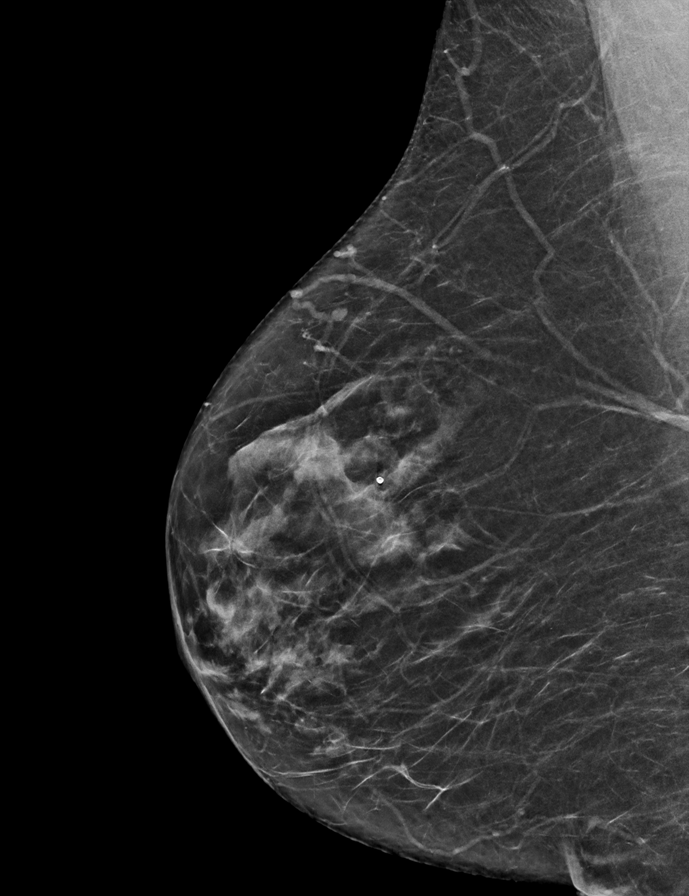

[L CC synth-2D]
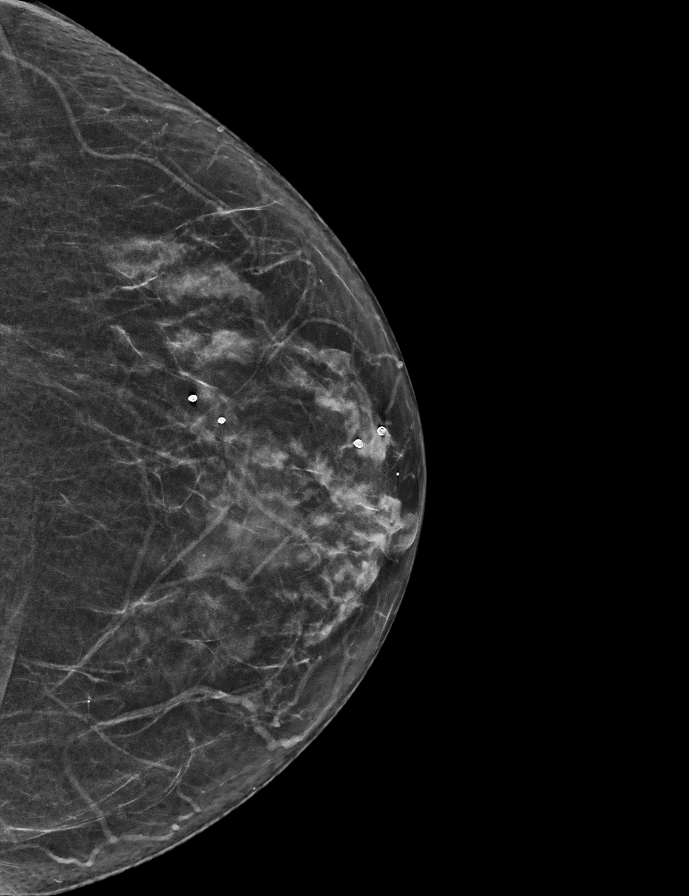

[L MLO synth-2D]
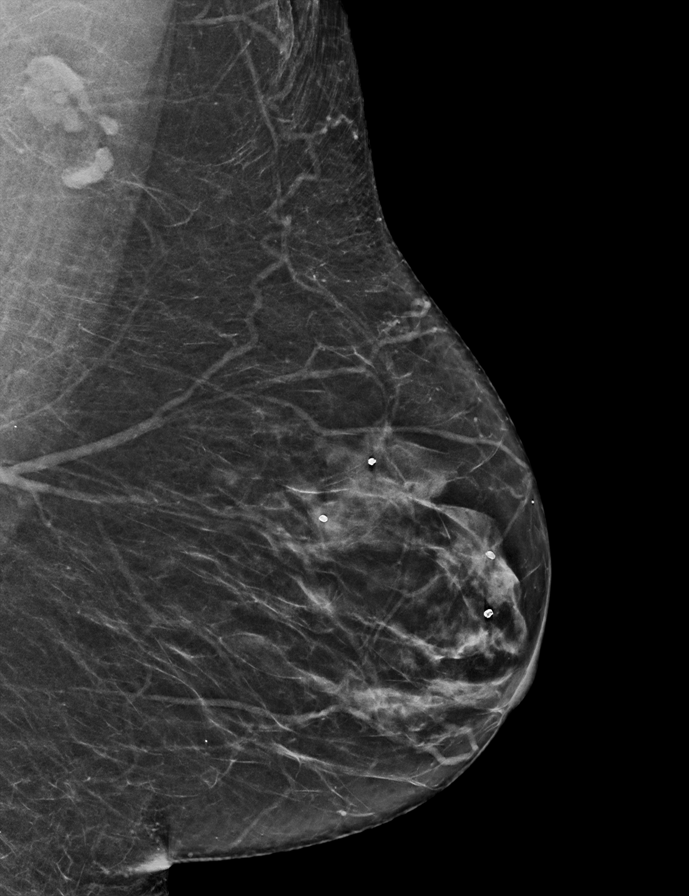

[R MLO tomo · 2 of 62 frames shown]
[frame 21/62]
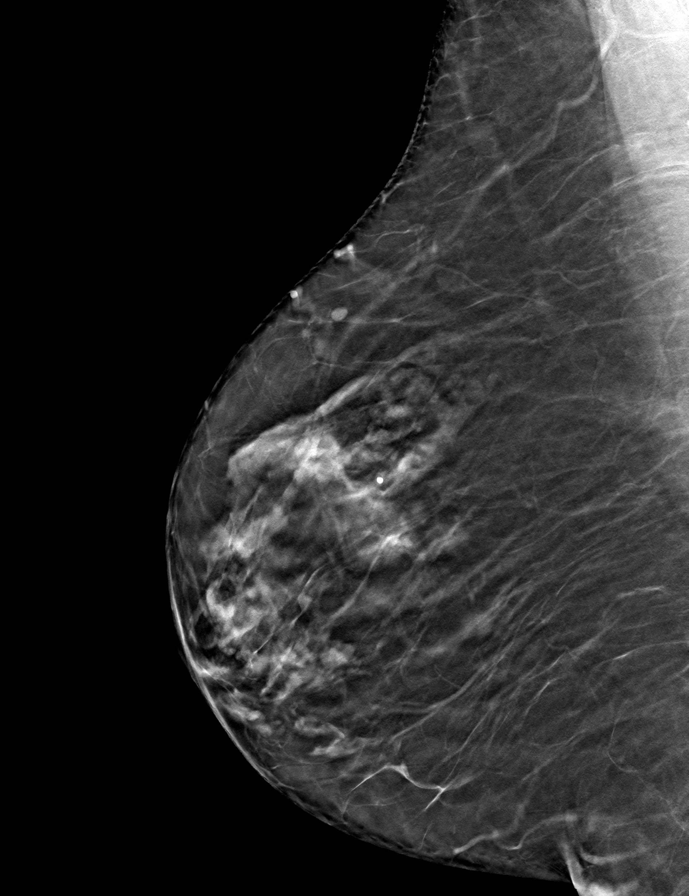
[frame 31/62]
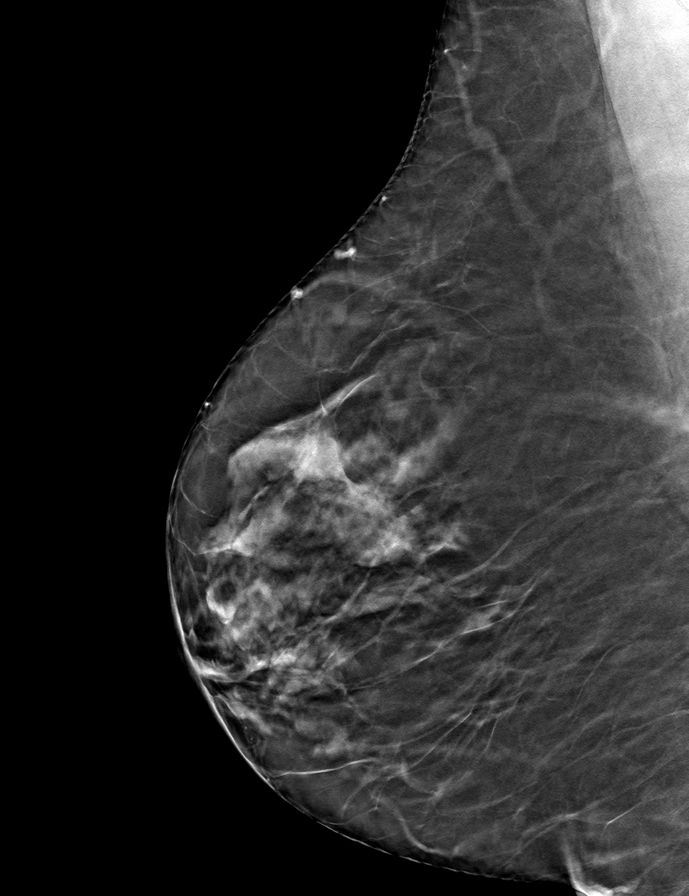

[L MLO tomo · tomo slice 32/63.0]
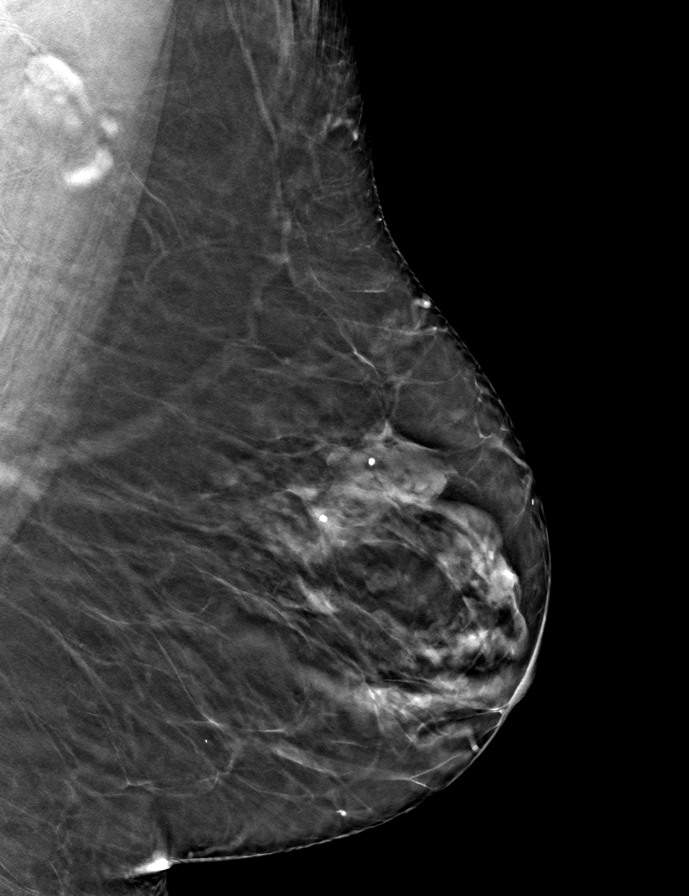

[L CC tomo · tomo slice 28/55.0]
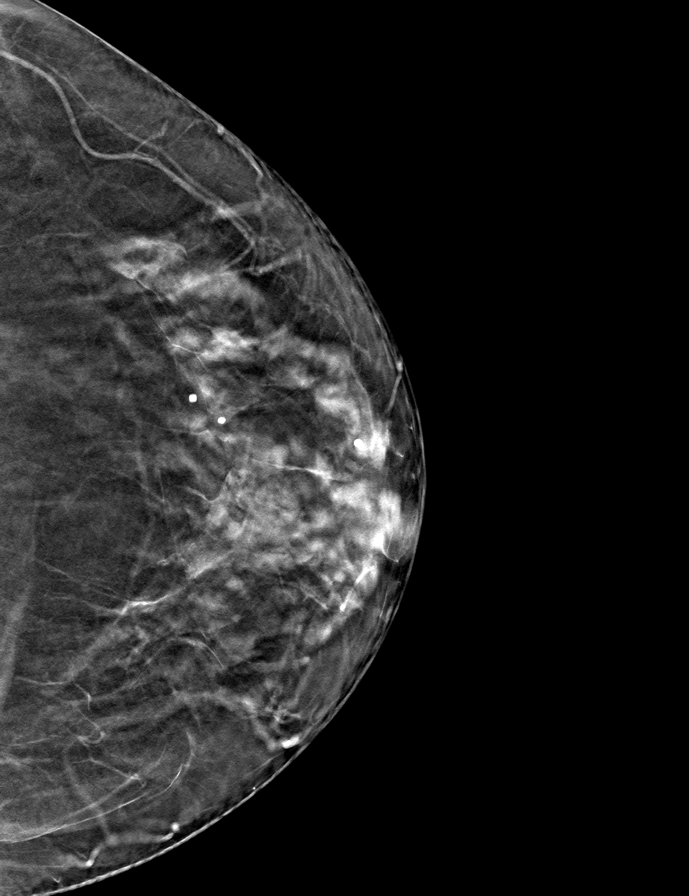

[R CC tomo · tomo slice 30/59.0]
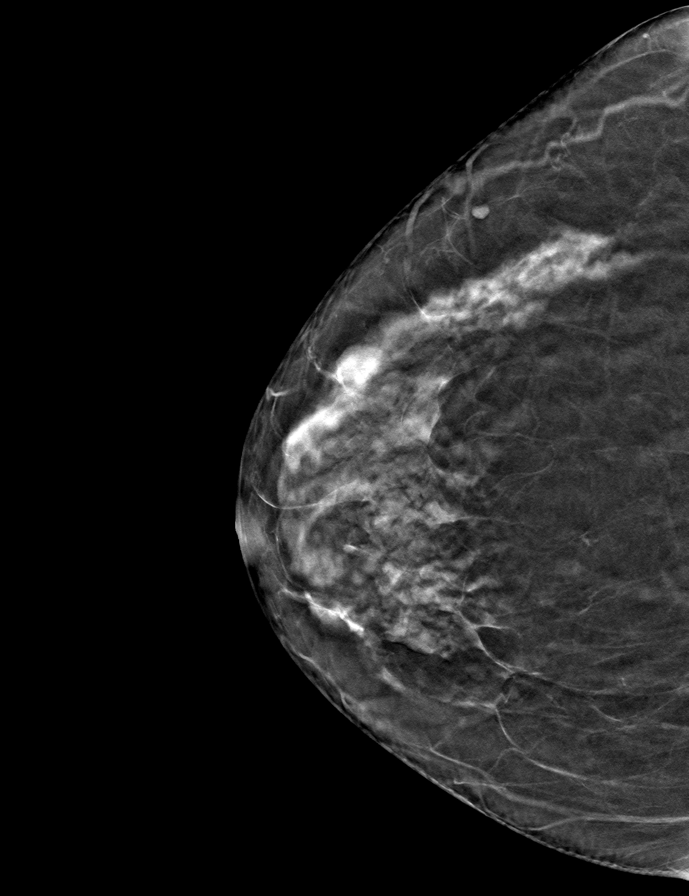

[9 of 24 positions shown; findings below may reference images not displayed]

ACR Breast Density Category b: There are scattered areas of
fibroglandular density.
FINDINGS: There are no findings suspicious for malignancy.
IMPRESSION: No mammographic evidence of malignancy. A result letter of this
screening mammogram will be mailed directly to the patient.

RECOMMENDATION:
Screening mammogram in one year. (Code:51-O-LD2)

BI-RADS CATEGORY  1: Negative.

## 2023-11-08 DIAGNOSIS — K080 Exfoliation of teeth due to systemic causes: Secondary | ICD-10-CM | POA: Diagnosis not present

## 2023-11-16 DIAGNOSIS — I700 Atherosclerosis of aorta: Secondary | ICD-10-CM | POA: Diagnosis not present

## 2024-02-07 ENCOUNTER — Ambulatory Visit: Payer: Medicare Other

## 2024-03-12 ENCOUNTER — Other Ambulatory Visit (HOSPITAL_BASED_OUTPATIENT_CLINIC_OR_DEPARTMENT_OTHER): Payer: Self-pay

## 2024-03-12 ENCOUNTER — Ambulatory Visit (HOSPITAL_BASED_OUTPATIENT_CLINIC_OR_DEPARTMENT_OTHER)
Admission: RE | Admit: 2024-03-12 | Discharge: 2024-03-12 | Disposition: A | Discharge status: Home / Self Care | Source: Ambulatory Visit | Attending: Family Medicine | Admitting: Family Medicine

## 2024-03-12 ENCOUNTER — Encounter (HOSPITAL_BASED_OUTPATIENT_CLINIC_OR_DEPARTMENT_OTHER): Payer: Self-pay

## 2024-03-12 VITALS — BP 135/75 | HR 20 | Temp 98.5°F | Resp 20

## 2024-03-12 DIAGNOSIS — J209 Acute bronchitis, unspecified: Secondary | ICD-10-CM | POA: Diagnosis not present

## 2024-03-12 MED ORDER — AZITHROMYCIN 250 MG PO TABS
250.0000 mg | ORAL_TABLET | Freq: Every day | ORAL | 0 refills | Status: DC
  Filled 2024-03-12: qty 6, 5d supply, fill #0

## 2024-03-12 MED ORDER — PREDNISONE 20 MG PO TABS
40.0000 mg | ORAL_TABLET | Freq: Every day | ORAL | 0 refills | Status: AC
  Filled 2024-03-12: qty 10, 5d supply, fill #0

## 2024-03-12 NOTE — Discharge Instructions (Signed)
 Treating you for bronchitis.  Take the medications as prescribed. Recommend Mucinex for congestion and cough over-the-counter.

## 2024-03-12 NOTE — ED Provider Notes (Signed)
 [facility]    CSN: [id number] Arrival date & time: 03/12/24  1252      History   Chief Complaint Chief Complaint  Patient presents with   Cough    HPI Julia Norman is a 73 y.o. female.   Patient is a 73 year old female who presents today with cough for over a week. Productive cough. Had one episode of watery stool this morning. Has taken 2 covid tests and both were negative. Last test taken Thursday. Has been taking OTC Nyquil for symptoms. States head feels funny. Describes it as dizziness.    Cough   Past Medical History:  Diagnosis Date   Cancer (HCC)    skin   Kidney stones    Urinary frequency 03/14/2022    Patient Active Problem List   Diagnosis Date Noted   Acute pain of right knee 08/09/2023   Chest pain 08/09/2023   Healthcare maintenance 05/05/2023   Mixed stress and urge urinary incontinence 02/01/2023   Chronic heel pain, right 02/01/2023   Dysuria 02/01/2023   Aortic atherosclerosis (HCC) 07/14/2022   Statin myopathy 07/14/2022   Other fatigue 07/14/2022   Osteopenia after menopause 07/14/2022   Encounter for immunization 07/14/2022   Screening mammogram for breast cancer 06/30/2022   Need for immunization against influenza 06/30/2022   Dizziness 03/14/2022   Renal calculus 07/28/2021   BMI 30.0-30.9,adult 07/15/2021   Nicotine dependence 04/02/2020   Nonsustained ventricular tachycardia (HCC) 07/05/2018   Cigarette smoker 07/05/2018   Bilateral carotid bruits 07/05/2018   Hyperlipidemia 05/30/2018   Skin cancer of arm, right 06/20/2017   Hearing loss 11/14/2015   Low back pain radiating to left lower extremity 10/17/2014   NEOPLASM, MALIGNANT, SKIN, MULTIPLE, HX OF 01/15/2009    Past Surgical History:  Procedure Laterality Date   APPENDECTOMY  02/18/2019   CHOLECYSTECTOMY      OB History   No obstetric history on file.      Home Medications    Prior to Admission medications   Medication Sig Start Date End  Date Taking? Authorizing Provider  azithromycin  (ZITHROMAX ) 250 MG tablet Take first 2 tablets by mouth together on day 1, then 1 every day until finished. 03/12/24  Yes Emerald Shor A, FNP  predniSONE  (DELTASONE ) 20 MG tablet Take 2 tablets (40 mg total) by mouth daily with breakfast for 5 days. 03/12/24 03/17/24 Yes Mithra Spano, Wilbert LABOR, FNP    Family History Family History  Problem Relation Age of Onset   Cancer Mother        lymphoma   Depression Mother    Gallbladder disease Mother    Heart disease Father    Breast cancer Maternal Aunt    Breast cancer Paternal Aunt     Social History Social History   Tobacco Use   Smoking status: Every Day    Current packs/day: 0.25    Average packs/day: 0.3 packs/day for 51.1 years (12.8 ttl pk-yrs)    Types: Cigarettes    Start date: 01/31/1973   Smokeless tobacco: Never  Vaping Use   Vaping status: Never Used  Substance Use Topics   Alcohol use: Yes    Comment: ocassionally   Drug use: No     Allergies   Codeine   Review of Systems Review of Systems  Respiratory:  Positive for cough.      Physical Exam Triage Vital Signs ED Triage Vitals  Encounter Vitals Group     BP 03/12/24 1300 135/75     Girls  Systolic BP Percentile --      Girls Diastolic BP Percentile --      Boys Systolic BP Percentile --      Boys Diastolic BP Percentile --      Pulse Rate 03/12/24 1300 (!) 20     Resp 03/12/24 1300 20     Temp 03/12/24 1300 98.5 F (36.9 C)     Temp Source 03/12/24 1300 Oral     SpO2 03/12/24 1300 97 %     Weight --      Height --      Head Circumference --      Peak Flow --      Pain Score 03/12/24 1302 0     Pain Loc --      Pain Education --      Exclude from Growth Chart --    No data found.  Updated Vital Signs BP 135/75 (BP Location: Right Arm)   Pulse (!) 20   Temp 98.5 F (36.9 C) (Oral)   Resp 20   LMP 06/23/2005 (Approximate) Comment: 10 years ago   SpO2 97%   Visual Acuity Right Eye Distance:   Left  Eye Distance:   Bilateral Distance:    Right Eye Near:   Left Eye Near:    Bilateral Near:     Physical Exam Constitutional:      General: She is not in acute distress.    Appearance: Normal appearance. She is not ill-appearing, toxic-appearing or diaphoretic.  HENT:     Head: Normocephalic and atraumatic.     Right Ear: Tympanic membrane and ear canal normal.     Left Ear: Tympanic membrane and ear canal normal.     Nose: Congestion present.     Mouth/Throat:     Pharynx: Oropharynx is clear.  Eyes:     Conjunctiva/sclera: Conjunctivae normal.  Cardiovascular:     Rate and Rhythm: Normal rate and regular rhythm.     Pulses: Normal pulses.     Heart sounds: Normal heart sounds.  Pulmonary:     Effort: Pulmonary effort is normal.     Breath sounds: Wheezing and rhonchi present.  Skin:    General: Skin is warm and dry.  Neurological:     Mental Status: She is alert.  Psychiatric:        Mood and Affect: Mood normal.      UC Treatments / Results  Labs (all labs ordered are listed, but only abnormal results are displayed) Labs Reviewed - No data to display  EKG   Radiology No results found.  Procedures Procedures (including critical care time)  Medications Ordered in UC Medications - No data to display  Initial Impression / Assessment and Plan / UC Course  I have reviewed the triage vital signs and the nursing notes.  Pertinent labs & imaging results that were available during my care of the patient were reviewed by me and considered in my medical decision making (see chart for details).     Acute bronchitis-treating with short course of prednisone  and Zithromax .  Recommend over-the-counter Mucinex for congestion and cough as needed.  Follow-up as needed Final Clinical Impressions(s) / UC Diagnoses   Final diagnoses:  Acute bronchitis, unspecified organism     Discharge Instructions      Treating you for bronchitis.  Take the medications as  prescribed. Recommend Mucinex for congestion and cough over-the-counter.    ED Prescriptions     Medication Sig Dispense Auth. Provider  azithromycin  (ZITHROMAX ) 250 MG tablet Take first 2 tablets by mouth together on day 1, then 1 every day until finished. 6 tablet Chuck Caban A, FNP   predniSONE  (DELTASONE ) 20 MG tablet Take 2 tablets (40 mg total) by mouth daily with breakfast for 5 days. 10 tablet Adah Wilbert LABOR, FNP      PDMP not reviewed this encounter.   Adah Wilbert LABOR, FNP 03/12/24 1359

## 2024-03-12 NOTE — ED Triage Notes (Signed)
 Cough for over a week. Productive cough. Had one episode of watery stool this morning. Has taken 2 covid tests and both were negative. Last test taken Thursday. Has been taking OTC Nyquil for symptoms. States head feels funny. Describes it as dizziness.

## 2024-04-12 DIAGNOSIS — L820 Inflamed seborrheic keratosis: Secondary | ICD-10-CM | POA: Diagnosis not present

## 2024-04-12 DIAGNOSIS — L570 Actinic keratosis: Secondary | ICD-10-CM | POA: Diagnosis not present

## 2024-04-12 DIAGNOSIS — L578 Other skin changes due to chronic exposure to nonionizing radiation: Secondary | ICD-10-CM | POA: Diagnosis not present

## 2024-04-12 DIAGNOSIS — D225 Melanocytic nevi of trunk: Secondary | ICD-10-CM | POA: Diagnosis not present

## 2024-04-12 DIAGNOSIS — L814 Other melanin hyperpigmentation: Secondary | ICD-10-CM | POA: Diagnosis not present

## 2024-06-21 ENCOUNTER — Encounter (HOSPITAL_BASED_OUTPATIENT_CLINIC_OR_DEPARTMENT_OTHER): Payer: Self-pay

## 2024-06-21 ENCOUNTER — Ambulatory Visit (HOSPITAL_BASED_OUTPATIENT_CLINIC_OR_DEPARTMENT_OTHER)
Admission: EM | Admit: 2024-06-21 | Discharge: 2024-06-21 | Disposition: A | Discharge status: Home / Self Care | Attending: Family Medicine | Admitting: Family Medicine

## 2024-06-21 DIAGNOSIS — Z20828 Contact with and (suspected) exposure to other viral communicable diseases: Secondary | ICD-10-CM | POA: Diagnosis not present

## 2024-06-21 DIAGNOSIS — R051 Acute cough: Secondary | ICD-10-CM

## 2024-06-21 DIAGNOSIS — R509 Fever, unspecified: Secondary | ICD-10-CM | POA: Diagnosis not present

## 2024-06-21 MED ORDER — PROMETHAZINE-DM 6.25-15 MG/5ML PO SYRP: 5.0000 mL | ORAL_SOLUTION | Freq: Four times a day (QID) | ORAL | 0 refills | Status: AC | PRN

## 2024-06-21 MED ORDER — OSELTAMIVIR PHOSPHATE 75 MG PO CAPS: 75.0000 mg | ORAL_CAPSULE | Freq: Two times a day (BID) | ORAL | 0 refills | Status: AC

## 2024-06-21 NOTE — ED Triage Notes (Signed)
 Cough Feels achy Fever  Started this morning

## 2024-06-21 NOTE — Discharge Instructions (Addendum)
 Probable influenza type a with fever and cough: Patient has a fever and cough and shares her home with her 73-year-old grandson who is positive for influenza type A today.  Will treat for probable type a influenza.  No testing done.  Patient agreed to be treated without testing.  Tamiflu 75 mg twice daily for 5 days.  Promethazine  DM, 5 mL, every 6 hours if needed for cough.  Get plenty of fluids and rest.  Follow-up if symptoms do not improve, worsen or new symptoms occur.

## 2024-06-21 NOTE — ED Provider Notes (Signed)
 " [facility]    CSN: [id number] Arrival date & time: 06/21/24  1528      History   Chief Complaint Chief Complaint  Patient presents with   Cough   Fever   Generalized Body Aches    HPI Julia Norman is a 74 y.o. female.   74 year old female who is the caregiver of her 37-year-old grandson.  Her grandson has had flulike symptoms for 2 days and is positive for influenza type A.  The patient developed cough, body aches and fever that started this morning (06/21/2024) at 4:30 AM.  She is concerned she might also have the flu.   Cough Associated symptoms: fever   Associated symptoms: no chest pain, no chills, no ear pain, no rash, no shortness of breath and no sore throat   Fever Associated symptoms: cough   Associated symptoms: no chest pain, no chills, no diarrhea, no dysuria, no ear pain, no nausea, no rash, no sore throat and no vomiting     Past Medical History:  Diagnosis Date   Cancer (HCC)    skin   Kidney stones    Urinary frequency 03/14/2022    Patient Active Problem List   Diagnosis Date Noted   Acute pain of right knee 08/09/2023   Chest pain 08/09/2023   Healthcare maintenance 05/05/2023   Mixed stress and urge urinary incontinence 02/01/2023   Chronic heel pain, right 02/01/2023   Dysuria 02/01/2023   Aortic atherosclerosis 07/14/2022   Statin myopathy 07/14/2022   Other fatigue 07/14/2022   Osteopenia after menopause 07/14/2022   Encounter for immunization 07/14/2022   Screening mammogram for breast cancer 06/30/2022   Need for immunization against influenza 06/30/2022   Dizziness 03/14/2022   Renal calculus 07/28/2021   BMI 30.0-30.9,adult 07/15/2021   Nicotine dependence 04/02/2020   Nonsustained ventricular tachycardia (HCC) 07/05/2018   Cigarette smoker 07/05/2018   Bilateral carotid bruits 07/05/2018   Hyperlipidemia 05/30/2018   Skin cancer of arm, right 06/20/2017   Hearing loss 11/14/2015   Low back pain radiating to left  lower extremity 10/17/2014   NEOPLASM, MALIGNANT, SKIN, MULTIPLE, HX OF 01/15/2009    Past Surgical History:  Procedure Laterality Date   APPENDECTOMY  02/18/2019   CHOLECYSTECTOMY      OB History   No obstetric history on file.      Home Medications    Prior to Admission medications  Medication Sig Start Date End Date Taking? Authorizing Provider  oseltamivir (TAMIFLU) 75 MG capsule Take 1 capsule (75 mg total) by mouth every 12 (twelve) hours. 06/21/24  Yes Ival Domino, FNP  promethazine -dextromethorphan (PROMETHAZINE -DM) 6.25-15 MG/5ML syrup Take 5 mLs by mouth 4 (four) times daily as needed for cough. Do not use and drive - May make drowsy. 06/21/24  Yes Ival Domino, FNP    Family History Family History  Problem Relation Age of Onset   Cancer Mother        lymphoma   Depression Mother    Gallbladder disease Mother    Heart disease Father    Breast cancer Maternal Aunt    Breast cancer Paternal Aunt     Social History Social History[1]   Allergies   Codeine   Review of Systems Review of Systems  Constitutional:  Positive for fever. Negative for chills.  HENT:  Negative for ear pain and sore throat.   Eyes:  Negative for pain and visual disturbance.  Respiratory:  Positive for cough. Negative for shortness of breath.  Cardiovascular:  Negative for chest pain and palpitations.  Gastrointestinal:  Negative for abdominal pain, constipation, diarrhea, nausea and vomiting.  Genitourinary:  Negative for dysuria and hematuria.  Musculoskeletal:  Positive for arthralgias. Negative for back pain.  Skin:  Negative for color change and rash.  Neurological:  Negative for seizures and syncope.  All other systems reviewed and are negative.    Physical Exam Triage Vital Signs ED Triage Vitals  Encounter Vitals Group     BP 06/21/24 1619 (!) 150/81     Girls Systolic BP Percentile --      Girls Diastolic BP Percentile --      Boys Systolic BP Percentile --       Boys Diastolic BP Percentile --      Pulse Rate 06/21/24 1619 73     Resp 06/21/24 1619 16     Temp 06/21/24 1619 99.2 F (37.3 C)     Temp Source 06/21/24 1619 Oral     SpO2 06/21/24 1619 96 %     Weight --      Height --      Head Circumference --      Peak Flow --      Pain Score 06/21/24 1618 4     Pain Loc --      Pain Education --      Exclude from Growth Chart --    No data found.  Updated Vital Signs BP (!) 150/81 (BP Location: Left Arm)   Pulse 73   Temp 99.2 F (37.3 C) (Oral)   Resp 16   LMP 06/23/2005 Comment: 10 years ago   SpO2 96%   Visual Acuity Right Eye Distance:   Left Eye Distance:   Bilateral Distance:    Right Eye Near:   Left Eye Near:    Bilateral Near:     Physical Exam Vitals and nursing note reviewed.  Constitutional:      General: She is not in acute distress.    Appearance: She is well-developed. She is not ill-appearing, toxic-appearing or diaphoretic.  HENT:     Head: Normocephalic and atraumatic.     Right Ear: Tympanic membrane, ear canal and external ear normal. Decreased hearing noted.     Left Ear: Tympanic membrane, ear canal and external ear normal. Decreased hearing noted.     Ears:     Comments: Patient is very hard of hearing even with use of bilateral hearing aids    Nose: Congestion and rhinorrhea present. Rhinorrhea is clear.     Right Sinus: No maxillary sinus tenderness or frontal sinus tenderness.     Left Sinus: No maxillary sinus tenderness or frontal sinus tenderness.     Mouth/Throat:     Lips: Pink.     Mouth: Mucous membranes are moist.     Pharynx: Uvula midline. No oropharyngeal exudate or posterior oropharyngeal erythema.     Tonsils: No tonsillar exudate.  Eyes:     Conjunctiva/sclera: Conjunctivae normal.     Pupils: Pupils are equal, round, and reactive to light.  Cardiovascular:     Rate and Rhythm: Normal rate and regular rhythm.     Heart sounds: S1 normal and S2 normal. No murmur  heard. Pulmonary:     Effort: Pulmonary effort is normal. No respiratory distress.     Breath sounds: Normal breath sounds. No decreased breath sounds, wheezing, rhonchi or rales.  Abdominal:     General: Bowel sounds are normal.     Palpations: Abdomen  is soft.     Tenderness: There is no abdominal tenderness.  Musculoskeletal:        General: No swelling.     Cervical back: Neck supple.  Lymphadenopathy:     Head:     Right side of head: No submental, submandibular, tonsillar, preauricular or posterior auricular adenopathy.     Left side of head: No submental, submandibular, tonsillar, preauricular or posterior auricular adenopathy.     Cervical: Cervical adenopathy present.     Right cervical: Superficial cervical adenopathy present.     Left cervical: Superficial cervical adenopathy present.  Skin:    General: Skin is warm and dry.     Capillary Refill: Capillary refill takes less than 2 seconds.     Findings: No rash.  Neurological:     Mental Status: She is alert and oriented to person, place, and time.  Psychiatric:        Mood and Affect: Mood normal.      UC Treatments / Results  Labs (all labs ordered are listed, but only abnormal results are displayed) Labs Reviewed - No data to display  EKG   Radiology No results found.  Procedures Procedures (including critical care time)  Medications Ordered in UC Medications - No data to display  Initial Impression / Assessment and Plan / UC Course  I have reviewed the triage vital signs and the nursing notes.  Pertinent labs & imaging results that were available during my care of the patient were reviewed by me and considered in my medical decision making (see chart for details).  Plan of Care (see discharge instructions for additional patient precautions and education): Probable influenza type a with fever and cough: Patient has a fever and cough and shares her home with her 71-year-old grandson who is positive for  influenza type A today.  Will treat for probable type a influenza.  No testing done.  Patient agreed to be treated without testing.  Tamiflu 75 mg twice daily for 5 days.  Promethazine  DM, 5 mL, every 6 hours if needed for cough.  Get plenty of fluids and rest.  Follow-up if symptoms do not improve, worsen or new symptoms occur.  I reviewed the plan of care with the patient and/or the patient's guardian.  The patient and/or guardian had time to ask questions and acknowledged that the questions were answered.  Final Clinical Impressions(s) / UC Diagnoses   Final diagnoses:  Fever, unspecified  Acute cough  Exposure to influenza     Discharge Instructions      Probable influenza type a with fever and cough: Patient has a fever and cough and shares her home with her 26-year-old grandson who is positive for influenza type A today.  Will treat for probable type a influenza.  No testing done.  Patient agreed to be treated without testing.  Tamiflu 75 mg twice daily for 5 days.  Promethazine  DM, 5 mL, every 6 hours if needed for cough.  Get plenty of fluids and rest.  Follow-up if symptoms do not improve, worsen or new symptoms occur.     ED Prescriptions     Medication Sig Dispense Auth. Provider   promethazine -dextromethorphan (PROMETHAZINE -DM) 6.25-15 MG/5ML syrup Take 5 mLs by mouth 4 (four) times daily as needed for cough. Do not use and drive - May make drowsy. 118 mL Ival Domino, FNP   oseltamivir (TAMIFLU) 75 MG capsule Take 1 capsule (75 mg total) by mouth every 12 (twelve) hours. 10 capsule Ival Domino,  FNP      PDMP not reviewed this encounter.    [1]  Social History Tobacco Use   Smoking status: Every Day    Current packs/day: 0.25    Average packs/day: 0.3 packs/day for 51.4 years (12.8 ttl pk-yrs)    Types: Cigarettes    Start date: 01/31/1973   Smokeless tobacco: Never  Vaping Use   Vaping status: Never Used  Substance Use Topics   Alcohol use: Yes     Comment: ocassionally   Drug use: No     Ival Domino, FNP 06/21/24 1656  "
